# Patient Record
Sex: Female | Born: 1955
Health system: Southern US, Community
[De-identification: ages and names within clinical notes are randomized; demographics above are authoritative.]

## PROBLEM LIST (undated history)

## (undated) DIAGNOSIS — M545 Low back pain, unspecified: Secondary | ICD-10-CM

## (undated) DIAGNOSIS — G8929 Other chronic pain: Secondary | ICD-10-CM

## (undated) DIAGNOSIS — M5430 Sciatica, unspecified side: Secondary | ICD-10-CM

## (undated) DIAGNOSIS — I10 Essential (primary) hypertension: Secondary | ICD-10-CM

## (undated) DIAGNOSIS — M1990 Unspecified osteoarthritis, unspecified site: Secondary | ICD-10-CM

## (undated) DIAGNOSIS — R52 Pain, unspecified: Secondary | ICD-10-CM

## (undated) DIAGNOSIS — Z951 Presence of aortocoronary bypass graft: Secondary | ICD-10-CM

## (undated) HISTORY — PX: CORONARY ARTERY BYPASS GRAFT: SHX141

## (undated) HISTORY — PX: TOTAL ABDOMINAL HYSTERECTOMY: SHX209

## (undated) HISTORY — PX: CARDIAC SURGERY: SHX584

## (undated) HISTORY — PX: ABDOMINAL HYSTERECTOMY: SHX81

---

## 1998-02-15 ENCOUNTER — Ambulatory Visit (HOSPITAL_COMMUNITY): Admission: RE | Admit: 1998-02-15 | Discharge: 1998-02-15 | Payer: Self-pay | Admitting: Emergency Medicine

## 2002-05-04 ENCOUNTER — Emergency Department (HOSPITAL_COMMUNITY): Admission: EM | Admit: 2002-05-04 | Discharge: 2002-05-04 | Payer: Self-pay | Admitting: Emergency Medicine

## 2003-08-07 ENCOUNTER — Encounter: Admission: RE | Admit: 2003-08-07 | Discharge: 2003-08-07 | Payer: Self-pay | Admitting: Internal Medicine

## 2006-03-09 ENCOUNTER — Encounter: Admission: RE | Admit: 2006-03-09 | Discharge: 2006-03-09 | Payer: Self-pay | Admitting: Internal Medicine

## 2008-07-01 ENCOUNTER — Encounter: Admission: RE | Admit: 2008-07-01 | Discharge: 2008-07-01 | Payer: Self-pay | Admitting: Internal Medicine

## 2008-08-24 ENCOUNTER — Encounter: Admission: RE | Admit: 2008-08-24 | Discharge: 2008-08-24 | Payer: Self-pay | Admitting: Internal Medicine

## 2009-01-25 ENCOUNTER — Ambulatory Visit: Payer: Self-pay | Admitting: Radiology

## 2009-01-25 ENCOUNTER — Encounter: Payer: Self-pay | Admitting: Emergency Medicine

## 2009-01-25 ENCOUNTER — Inpatient Hospital Stay (HOSPITAL_COMMUNITY): Admission: EM | Admit: 2009-01-25 | Discharge: 2009-02-03 | Payer: Self-pay | Admitting: Cardiology

## 2009-01-26 ENCOUNTER — Ambulatory Visit: Payer: Self-pay | Admitting: Thoracic Surgery (Cardiothoracic Vascular Surgery)

## 2009-01-28 ENCOUNTER — Encounter: Payer: Self-pay | Admitting: Thoracic Surgery (Cardiothoracic Vascular Surgery)

## 2009-02-22 ENCOUNTER — Encounter
Admission: RE | Admit: 2009-02-22 | Discharge: 2009-02-22 | Payer: Self-pay | Admitting: Thoracic Surgery (Cardiothoracic Vascular Surgery)

## 2009-02-24 ENCOUNTER — Ambulatory Visit: Payer: Self-pay | Admitting: Thoracic Surgery (Cardiothoracic Vascular Surgery)

## 2009-03-16 ENCOUNTER — Ambulatory Visit: Payer: Self-pay | Admitting: Thoracic Surgery (Cardiothoracic Vascular Surgery)

## 2009-03-22 ENCOUNTER — Ambulatory Visit: Payer: Self-pay | Admitting: Thoracic Surgery (Cardiothoracic Vascular Surgery)

## 2009-04-15 ENCOUNTER — Ambulatory Visit: Payer: Self-pay | Admitting: Thoracic Surgery (Cardiothoracic Vascular Surgery)

## 2009-05-06 ENCOUNTER — Telehealth (INDEPENDENT_AMBULATORY_CARE_PROVIDER_SITE_OTHER): Payer: Self-pay | Admitting: *Deleted

## 2009-05-21 ENCOUNTER — Ambulatory Visit: Payer: Self-pay | Admitting: Thoracic Surgery (Cardiothoracic Vascular Surgery)

## 2009-06-18 ENCOUNTER — Ambulatory Visit: Payer: Self-pay | Admitting: Diagnostic Radiology

## 2009-06-18 ENCOUNTER — Emergency Department (HOSPITAL_BASED_OUTPATIENT_CLINIC_OR_DEPARTMENT_OTHER): Admission: EM | Admit: 2009-06-18 | Discharge: 2009-06-18 | Payer: Self-pay | Admitting: Emergency Medicine

## 2009-08-30 ENCOUNTER — Encounter: Admission: RE | Admit: 2009-08-30 | Discharge: 2009-08-30 | Payer: Self-pay | Admitting: Internal Medicine

## 2009-09-05 ENCOUNTER — Emergency Department (HOSPITAL_BASED_OUTPATIENT_CLINIC_OR_DEPARTMENT_OTHER): Admission: EM | Admit: 2009-09-05 | Discharge: 2009-09-05 | Payer: Self-pay | Admitting: Emergency Medicine

## 2009-09-08 ENCOUNTER — Emergency Department (HOSPITAL_BASED_OUTPATIENT_CLINIC_OR_DEPARTMENT_OTHER): Admission: EM | Admit: 2009-09-08 | Discharge: 2009-09-08 | Payer: Self-pay | Admitting: Emergency Medicine

## 2009-09-10 ENCOUNTER — Emergency Department (HOSPITAL_BASED_OUTPATIENT_CLINIC_OR_DEPARTMENT_OTHER): Admission: EM | Admit: 2009-09-10 | Discharge: 2009-09-10 | Payer: Self-pay | Admitting: Emergency Medicine

## 2010-04-14 ENCOUNTER — Emergency Department (HOSPITAL_BASED_OUTPATIENT_CLINIC_OR_DEPARTMENT_OTHER): Admission: EM | Admit: 2010-04-14 | Discharge: 2009-09-02 | Payer: Self-pay | Admitting: Emergency Medicine

## 2010-05-29 ENCOUNTER — Encounter: Payer: Self-pay | Admitting: Thoracic Surgery (Cardiothoracic Vascular Surgery)

## 2010-05-29 ENCOUNTER — Encounter: Payer: Self-pay | Admitting: Internal Medicine

## 2010-05-29 ENCOUNTER — Encounter: Payer: Self-pay | Admitting: Orthopedic Surgery

## 2010-08-12 LAB — POCT I-STAT 4, (NA,K, GLUC, HGB,HCT)
Glucose, Bld: 137 mg/dL — ABNORMAL HIGH (ref 70–99)
Glucose, Bld: 164 mg/dL — ABNORMAL HIGH (ref 70–99)
Glucose, Bld: 166 mg/dL — ABNORMAL HIGH (ref 70–99)
HCT: 29 % — ABNORMAL LOW (ref 36.0–46.0)
HCT: 32 % — ABNORMAL LOW (ref 36.0–46.0)
Hemoglobin: 10.2 g/dL — ABNORMAL LOW (ref 12.0–15.0)
Hemoglobin: 12.9 g/dL (ref 12.0–15.0)
Hemoglobin: 13.9 g/dL (ref 12.0–15.0)
Potassium: 3.2 mEq/L — ABNORMAL LOW (ref 3.5–5.1)
Potassium: 3.7 mEq/L (ref 3.5–5.1)
Potassium: 4.8 mEq/L (ref 3.5–5.1)
Sodium: 137 mEq/L (ref 135–145)
Sodium: 138 mEq/L (ref 135–145)

## 2010-08-12 LAB — BASIC METABOLIC PANEL
BUN: 10 mg/dL (ref 6–23)
BUN: 11 mg/dL (ref 6–23)
BUN: 9 mg/dL (ref 6–23)
CO2: 23 mEq/L (ref 19–32)
CO2: 26 mEq/L (ref 19–32)
CO2: 27 mEq/L (ref 19–32)
CO2: 28 mEq/L (ref 19–32)
CO2: 29 mEq/L (ref 19–32)
CO2: 29 mEq/L (ref 19–32)
CO2: 30 mEq/L (ref 19–32)
Calcium: 8.5 mg/dL (ref 8.4–10.5)
Calcium: 8.5 mg/dL (ref 8.4–10.5)
Calcium: 8.6 mg/dL (ref 8.4–10.5)
Calcium: 8.6 mg/dL (ref 8.4–10.5)
Calcium: 9 mg/dL (ref 8.4–10.5)
Calcium: 9.3 mg/dL (ref 8.4–10.5)
Calcium: 9.6 mg/dL (ref 8.4–10.5)
Chloride: 100 mEq/L (ref 96–112)
Chloride: 102 mEq/L (ref 96–112)
Chloride: 104 mEq/L (ref 96–112)
Chloride: 104 mEq/L (ref 96–112)
Chloride: 104 mEq/L (ref 96–112)
Chloride: 98 mEq/L (ref 96–112)
Creatinine, Ser: 0.64 mg/dL (ref 0.4–1.2)
Creatinine, Ser: 0.65 mg/dL (ref 0.4–1.2)
Creatinine, Ser: 0.68 mg/dL (ref 0.4–1.2)
Creatinine, Ser: 0.7 mg/dL (ref 0.4–1.2)
Creatinine, Ser: 0.82 mg/dL (ref 0.4–1.2)
GFR calc Af Amer: >60 mL/min (ref >60)
GFR calc Af Amer: >60 mL/min (ref >60)
GFR calc Af Amer: >60 mL/min (ref >60)
GFR calc Af Amer: >60 mL/min (ref >60)
GFR calc Af Amer: >60 mL/min (ref >60)
GFR calc Af Amer: >60 mL/min (ref >60)
GFR calc non Af Amer: >60 mL/min (ref >60)
GFR calc non Af Amer: >60 mL/min (ref >60)
GFR calc non Af Amer: >60 mL/min (ref >60)
GFR calc non Af Amer: >60 mL/min (ref >60)
GFR calc non Af Amer: >60 mL/min (ref >60)
Glucose, Bld: 159 mg/dL — ABNORMAL HIGH (ref 70–99)
Glucose, Bld: 159 mg/dL — ABNORMAL HIGH (ref 70–99)
Glucose, Bld: 166 mg/dL — ABNORMAL HIGH (ref 70–99)
Glucose, Bld: 170 mg/dL — ABNORMAL HIGH (ref 70–99)
Glucose, Bld: 204 mg/dL — ABNORMAL HIGH (ref 70–99)
Potassium: 3.7 mEq/L (ref 3.5–5.1)
Potassium: 3.8 mEq/L (ref 3.5–5.1)
Potassium: 3.9 mEq/L (ref 3.5–5.1)
Sodium: 134 mEq/L — ABNORMAL LOW (ref 135–145)
Sodium: 134 mEq/L — ABNORMAL LOW (ref 135–145)
Sodium: 135 mEq/L (ref 135–145)
Sodium: 138 mEq/L (ref 135–145)
Sodium: 138 mEq/L (ref 135–145)
Sodium: 140 mEq/L (ref 135–145)

## 2010-08-12 LAB — CBC
HCT: 25.6 % — ABNORMAL LOW (ref 36.0–46.0)
HCT: 27.9 % — ABNORMAL LOW (ref 36.0–46.0)
HCT: 42.1 % (ref 36.0–46.0)
HCT: 42.6 % (ref 36.0–46.0)
Hemoglobin: 10.7 g/dL — ABNORMAL LOW (ref 12.0–15.0)
Hemoglobin: 11.1 g/dL — ABNORMAL LOW (ref 12.0–15.0)
Hemoglobin: 13.6 g/dL (ref 12.0–15.0)
Hemoglobin: 14.3 g/dL (ref 12.0–15.0)
Hemoglobin: 14.4 g/dL (ref 12.0–15.0)
Hemoglobin: 15 g/dL (ref 12.0–15.0)
Hemoglobin: 8.8 g/dL — ABNORMAL LOW (ref 12.0–15.0)
Hemoglobin: 9.5 g/dL — ABNORMAL LOW (ref 12.0–15.0)
MCHC: 33.5 g/dL (ref 30.0–36.0)
MCHC: 33.7 g/dL (ref 30.0–36.0)
MCHC: 33.9 g/dL (ref 30.0–36.0)
MCHC: 34 g/dL (ref 30.0–36.0)
MCHC: 34 g/dL (ref 30.0–36.0)
MCHC: 34.1 g/dL (ref 30.0–36.0)
MCHC: 34.4 g/dL (ref 30.0–36.0)
MCV: 91 fL (ref 78.0–100.0)
MCV: 91.1 fL (ref 78.0–100.0)
MCV: 91.3 fL (ref 78.0–100.0)
MCV: 91.9 fL (ref 78.0–100.0)
MCV: 91.9 fL (ref 78.0–100.0)
MCV: 92.3 fL (ref 78.0–100.0)
MCV: 92.7 fL (ref 78.0–100.0)
Platelets: 167 10*3/uL (ref 150–400)
Platelets: 189 10*3/uL (ref 150–400)
Platelets: 191 10*3/uL (ref 150–400)
Platelets: 261 10*3/uL (ref 150–400)
RBC: 2.81 MIL/uL — ABNORMAL LOW (ref 3.87–5.11)
RBC: 3.03 MIL/uL — ABNORMAL LOW (ref 3.87–5.11)
RBC: 3.43 MIL/uL — ABNORMAL LOW (ref 3.87–5.11)
RBC: 3.58 MIL/uL — ABNORMAL LOW (ref 3.87–5.11)
RBC: 4.56 MIL/uL (ref 3.87–5.11)
RBC: 4.64 MIL/uL (ref 3.87–5.11)
RDW: 13.1 % (ref 11.5–15.5)
RDW: 13.4 % (ref 11.5–15.5)
RDW: 13.5 % (ref 11.5–15.5)
RDW: 13.6 % (ref 11.5–15.5)
RDW: 13.9 % (ref 11.5–15.5)
WBC: 11.6 10*3/uL — ABNORMAL HIGH (ref 4.0–10.5)
WBC: 12 10*3/uL — ABNORMAL HIGH (ref 4.0–10.5)
WBC: 12.1 10*3/uL — ABNORMAL HIGH (ref 4.0–10.5)
WBC: 12.4 10*3/uL — ABNORMAL HIGH (ref 4.0–10.5)
WBC: 6.8 10*3/uL (ref 4.0–10.5)
WBC: 7.2 10*3/uL (ref 4.0–10.5)
WBC: 9.2 10*3/uL (ref 4.0–10.5)

## 2010-08-12 LAB — BLOOD GAS, ARTERIAL
Acid-Base Excess: 1.8 mmol/L (ref 0.0–2.0)
O2 Saturation: 97.5 %
TCO2: 26.6 mmol/L (ref 0–100)
pCO2 arterial: 37.4 mmHg (ref 35.0–45.0)
pO2, Arterial: 90.2 mmHg (ref 80.0–100.0)

## 2010-08-12 LAB — GLUCOSE, CAPILLARY
Glucose-Capillary: 116 mg/dL — ABNORMAL HIGH (ref 70–99)
Glucose-Capillary: 120 mg/dL — ABNORMAL HIGH (ref 70–99)
Glucose-Capillary: 126 mg/dL — ABNORMAL HIGH (ref 70–99)
Glucose-Capillary: 129 mg/dL — ABNORMAL HIGH (ref 70–99)
Glucose-Capillary: 130 mg/dL — ABNORMAL HIGH (ref 70–99)
Glucose-Capillary: 145 mg/dL — ABNORMAL HIGH (ref 70–99)
Glucose-Capillary: 154 mg/dL — ABNORMAL HIGH (ref 70–99)
Glucose-Capillary: 155 mg/dL — ABNORMAL HIGH (ref 70–99)
Glucose-Capillary: 164 mg/dL — ABNORMAL HIGH (ref 70–99)
Glucose-Capillary: 165 mg/dL — ABNORMAL HIGH (ref 70–99)
Glucose-Capillary: 169 mg/dL — ABNORMAL HIGH (ref 70–99)
Glucose-Capillary: 170 mg/dL — ABNORMAL HIGH (ref 70–99)
Glucose-Capillary: 172 mg/dL — ABNORMAL HIGH (ref 70–99)
Glucose-Capillary: 180 mg/dL — ABNORMAL HIGH (ref 70–99)
Glucose-Capillary: 183 mg/dL — ABNORMAL HIGH (ref 70–99)
Glucose-Capillary: 191 mg/dL — ABNORMAL HIGH (ref 70–99)
Glucose-Capillary: 225 mg/dL — ABNORMAL HIGH (ref 70–99)
Glucose-Capillary: 94 mg/dL (ref 70–99)

## 2010-08-12 LAB — POCT I-STAT 3, ART BLOOD GAS (G3+)
Acid-base deficit: 1 mmol/L (ref 0.0–2.0)
O2 Saturation: 100 %
O2 Saturation: 95 %
Patient temperature: 35.3
Patient temperature: 37
TCO2: 25 mmol/L (ref 0–100)
pH, Arterial: 7.372 (ref 7.350–7.400)

## 2010-08-12 LAB — CREATININE, SERUM
Creatinine, Ser: 0.6 mg/dL (ref 0.4–1.2)
Creatinine, Ser: 0.65 mg/dL (ref 0.4–1.2)
GFR calc Af Amer: >60 mL/min (ref >60)
GFR calc Af Amer: >60 mL/min (ref >60)
GFR calc non Af Amer: >60 mL/min (ref >60)

## 2010-08-12 LAB — DIFFERENTIAL
Eosinophils Absolute: 0.2 10*3/uL (ref 0.0–0.7)
Eosinophils Relative: 2 % (ref 0–5)
Lymphs Abs: 2.3 10*3/uL (ref 0.7–4.0)

## 2010-08-12 LAB — POCT I-STAT, CHEM 8
Creatinine, Ser: 0.5 mg/dL (ref 0.4–1.2)
Glucose, Bld: 133 mg/dL — ABNORMAL HIGH (ref 70–99)
Hemoglobin: 12.6 g/dL (ref 12.0–15.0)
TCO2: 24 mmol/L (ref 0–100)

## 2010-08-12 LAB — PROTIME-INR
INR: 1 (ref 0.00–1.49)
Prothrombin Time: 16.4 seconds — ABNORMAL HIGH (ref 11.6–15.2)

## 2010-08-12 LAB — CARDIAC PANEL(CRET KIN+CKTOT+MB+TROPI)
CK, MB: 1 ng/mL (ref 0.3–4.0)
CK, MB: 1 ng/mL (ref 0.3–4.0)
CK, MB: 1 ng/mL (ref 0.3–4.0)
Relative Index: INVALID (ref 0.0–2.5)
Relative Index: INVALID (ref 0.0–2.5)
Total CK: 71 U/L (ref 7–177)
Total CK: 71 U/L (ref 7–177)
Troponin I: 0.02 ng/mL (ref 0.00–0.06)

## 2010-08-12 LAB — MAGNESIUM: Magnesium: 1.9 mg/dL (ref 1.5–2.5)

## 2010-08-12 LAB — HEPATIC FUNCTION PANEL
ALT: 20 U/L (ref 0–35)
AST: 15 U/L (ref 0–37)
Alkaline Phosphatase: 75 U/L (ref 39–117)
Total Bilirubin: 1.1 mg/dL (ref 0.3–1.2)
Total Protein: 6.8 g/dL (ref 6.0–8.3)

## 2010-08-12 LAB — HEMOGLOBIN A1C
Hgb A1c MFr Bld: 8.9 % — ABNORMAL HIGH (ref 4.6–6.1)
Mean Plasma Glucose: 209 mg/dL

## 2010-08-12 LAB — LIPID PANEL
Cholesterol: 208 mg/dL — ABNORMAL HIGH (ref 0–200)
HDL: 41 mg/dL (ref >39)
Triglycerides: 293 mg/dL — ABNORMAL HIGH (ref <150)

## 2010-08-12 LAB — URINALYSIS, DIPSTICK ONLY
Glucose, UA: NEGATIVE mg/dL
Leukocytes, UA: NEGATIVE
Nitrite: NEGATIVE
Specific Gravity, Urine: 1.028 (ref 1.005–1.030)
pH: 6 (ref 5.0–8.0)

## 2010-08-12 LAB — HEMOGLOBIN AND HEMATOCRIT, BLOOD
HCT: 28.3 % — ABNORMAL LOW (ref 36.0–46.0)
Hemoglobin: 9.6 g/dL — ABNORMAL LOW (ref 12.0–15.0)

## 2010-08-12 LAB — MRSA PCR SCREENING: MRSA by PCR: NEGATIVE

## 2010-08-12 LAB — PLATELET COUNT: Platelets: 185 10*3/uL (ref 150–400)

## 2010-08-12 LAB — TYPE AND SCREEN: ABO/RH(D): O POS

## 2010-09-20 NOTE — Assessment & Plan Note (Signed)
OFFICE VISIT   Gail Humphrey, Gail Humphrey  DOB:  [birth year]                                        April 15, 2009  CHART #:  [id number]   HISTORY OF PRESENT ILLNESS:  The patient returns for followup.  She had  a coronary bypass grafting back in September.  She had been having  significant incisional pain ever since.  She says that especially when  she wakes up in the morning she has intense pain, particularly at the  upper portion of her sternum.  She has pain all along the incision and  sometimes a burning pain that is under both breasts and also complains  of pain in her incision in her left knee and some numbness around that  site.   PHYSICAL EXAMINATION:  The patient's blood pressure is 151/92, pulse 72,  respirations are 18, her ox saturation is 98% on room air.  Her sternum  is stable.  Sternal incision is well healed.  Leg incision is well  healed.  There are no signs of infection.   IMPRESSION:  The patient continues to have postoperative pain both at  her sternal and leg incisions.  The incisions are healing well with no  signs of infection or inflammation.  I gave her a prescription for  additional 100 Percocet tablets 5/325.  She had concerns about the  amount of acetaminophen, but I told her as long as she is taking less  than 12 tablets a day which she is it should not be an issue.  She can  take 1-2 as needed up to 4 times daily p.r.n.  She is set to return to  work on May 16, 2008.  She has concerns about lifting and I do not  think she will be able to lift heavy objects which could cause  difficulties with her job, but I am going to restrict her at this  point to lifting anything that weighs over 25 pounds until we have some  improvement in her pain.   Revonda Standard Roxan Hockey, M.D.  Electronically Signed   SCH/MEDQ  D:  04/15/2009  T:  04/16/2009  Job:  [id number]   cc:   Jilda Panda, M.D.  Revonda Standard Rohrbeck, MD

## 2010-09-20 NOTE — Assessment & Plan Note (Signed)
OFFICE VISIT   Gail Humphrey, Gail Humphrey  DOB:  08/03/1955                                        May 21, 2009  CHART #:  [id number]   The patient is a 55 year old woman who had coronary bypass grafting in  September 2010.  She had been having significant incisional pain since  that time.  She was last seen in the office on April 15, 2009.  At  which time, she was still having significant pain in both her sternal  and leg incisions.  She was taking Percocet several times daily and was  not able to return to work at that time.  She did return to work early  this week, but presently is on light duty as we still have her  restricted from lifting any objects greater than 25 pounds.  I feel that  she is not doing so for at least 6 months.   On physical examination, the sternum is stable.  The sternal and leg  incisions are both well healed with no signs of infection.   IMPRESSION:  The patient has persistent postoperative pain.  She is  about 3 months out from surgery and has gotten better.  She says she is  now not taking any pain medication when she is at work and she is only  taking 1 or 2 pain pills a day, mostly she takes 1 at night before she  goes to bed.  Her pain has improved significantly, but is still present.  I did discuss with her an option of going back again and removing the  sternal wires as most of her pain is related to that area versus the leg  at the present time.  She does not want entertain that thought  presently, and also it would be better to wait a little longer as she  has shown some improvement, but there remains an option if she were to  continue to have pain she will contact us and we can discuss that  further.  In the in  the meantime, I still believe that she should not lift any objects  weighing greater than 25 pounds until 6 months from the time of her  surgery.   Revonda Standard Roxan Hockey, M.D.  Electronically Signed   SCH/MEDQ   D:  05/21/2009  T:  05/22/2009  Job:  [id number]   cc:   Jilda Panda, M.D.  Dr. Clarene Critchley

## 2010-09-20 NOTE — Assessment & Plan Note (Signed)
OFFICE VISIT   Gail Humphrey, Gail Humphrey  DOB:  08-22-1955                                        March 16, 2009  CHART #:  [id number]   Reason is followup after coronary bypass grafting.   HISTORY:  The patient is a 55 year old woman who underwent coronary  bypass grafting x4 on January 29, 2009.  She was seen in the office on  February 24, 2009, at which time she was complaining still of severe  pain.  She had been taking hydrocodone which was not giving her any  relief.  She had done well with oxycodone prior to that.  She was  complaining of pain in her calf.  A duplex was done to rule out DVT and  that was negative.  She was given a prescription for oxycodone 10 mg  tablets 50 of them, she has been taking about 4 of those a day.  Apparently she and Dr. Marlou Porch have had some type of falling out and she  will not be following up with him.   The patient states that she has been having continued pain.  She  describes in her shoulder, chest incision, breasts bilaterally, legs  both sides, both groins.   PHYSICAL EXAMINATION:  The patient is a 55 year old woman in no acute  distress.  Blood pressure 152/95, pulse 77, respirations 18, her oxygen  saturation is 96% on room air.  Her incisions are healing well with no  signs of infection.  Her sternum is stable, but she does have  significant tenderness with palpation along the sternum and with  coughing but I do not feel any clicking or popping or movement of the  sternum.  She does have tenderness in both groins to palpation.  I do  not feel any evidence of pseudoaneurysm.   No laboratory data today.   IMPRESSION:  The patient is a 55 year old woman status post coronary  bypass grafting.  She is still having a great deal of difficulty with  pain.  She has also got a lot of issues related to financial concerns  and whether if she will be able to return to work.  She is still taking  narcotics with relative  frequency but is only 6 weeks out from surgery,  so it is not out of the realm of normal, although she is on the high end  of the scale in terms of discomfort.  Notably, she does complain of pain  even with palpation in the right groin pain.  I gave her a prescription  for oxycodone 5/325 one to two 4 times daily as needed for pain, 100  tablets, no refills, but I did discuss with her that how easy it is to  become tolerant to or even become addicted to narcotics in this  situation, and I want to have her off them by 6 weeks from now at the  very latest.  I did advise her to try Aleve or Advil in the interim and  only reserve the narcotics when she was having more severe pain.  I did  discuss with her that anti-inflammatories may relieve the underlying  source of the pain better because the narcotics did not do anything to  decrease inflammation.  I will plan to see her back in a month.  We will  check on her  progress at that time.  I did tell her in terms of return  to work I think it will probably be the first of the year before she is  able to go back, but if she does have a remarkable transformation and  becomes more comfortable and more active and able to be much more  physically active then she could go back at anytime.  We are going to  help her try and set up an appointment with the cardiologist.  She had  heard good things about Dr. Daneen Schick although he is in the same  practice with Dr. Marlou Porch.  We will try and set that up if possible.  If  not, we will try and set her up with Dr. Marijo File at the [facility] in  [facility].   Revonda Standard Roxan Hockey, M.D.  Electronically Signed   SCH/MEDQ  D:  03/16/2009  T:  03/16/2009  Job:  [id number]   cc:   Jilda Panda, M.D.

## 2010-09-20 NOTE — Assessment & Plan Note (Signed)
OFFICE VISIT   ARBIE, Gail Humphrey  DOB:  [birth year]                                        February 24, 2009  CHART #:  [id number]   REASON FOR VISIT:  Followup after recent coronary bypass grafting.   HISTORY OF PRESENT ILLNESS:  The patient is a 55 year old woman with  multiple cardiac risk factors who presented with 2 weeks of progressive  chest pain and shortness of breath.  She was admitted with unstable  symptoms and at catheterization had left main and three-vessel coronary  disease.  She underwent coronary bypass grafting x4 on January 29, 2009.  Postoperatively, her course was uncomplicated.  She was  discharged on postoperative day #5.  Since then, she also complaints she  is still having a lot of discomfort primarily in her breasts.  She says  her nipples are very sensitive.  She is not feeling clicking or popping  in the sternum.  She had been taking oxycodone which gave her good  relief, but when she was changed to Vicodin she was not having adequate  relief, she is having some difficulty sleeping, her appetite remains  poor.  Her weight is about 5 pounds up from her normal while she was  going into the hospital.  She also complains of some pain in her calves  primarily on the left side.   PHYSICAL EXAMINATION:  The patient is a 55 year old woman in obvious  discomfort but no acute distress.  Neurologically, she is alert and  oriented x3 with no focal deficits.  Her neck is without jugular venous  distention.  Cardiac exam has regular rate and rhythm.  Normal S1 and  S2.  Lungs are clear with equal breath sounds bilaterally.  Her sternal  incision is clean, dry, intact, and healing well.  Chest tube sites have  eschar, but no signs of infection.  Leg incisions are healing well.  She  does have trace pitting edema on the right leg and 1+ pitting edema on  the left.  She has an incision on the right leg but the vein was not  harvested from that  side.  The vein was harvested from the left side.  She does have some calf tenderness on the left.   DIAGNOSTIC TESTS:  Chest x-ray shows good aeration of the lungs  bilaterally.  There is no effusions or infiltrates.   IMPRESSION:  The patient is a 55 year old woman who is now about 3 weeks  out from coronary bypass grafting and is progressing about as expected.  She may be a little more pain than the average, but other than that her  progress has been about as expected.  Her exercise has been somewhat  limited primarily due to discomfort.  I did give her a prescription for  oxycodone 10/325 and 50 tablets.  He can take one of those up to 3 times  a day as needed for pain.  She does have some edema in the left leg  where the vein was harvested.  I am going to put her back on a lower  dose of Lasix 20 mg daily for about 2 weeks just until her weight gets  back to normal.  I did give her 1 refill on that prescription in case  she continues to have swelling beyond that time, but I  suspect that will  take care of it.  I am not going to put her on a potassium supplement.  I did advise her to eat a banana or tomato daily until she is off the  Lasix.   She does have calf tenderness which is a little more prominent than you  would typically see with an endoscopic saphenous venectomy, and I am not  sure if this is just a manifestation of her generalized tenderness, but  with a combination of some swelling and tenderness I do think that a  duplex is necessary to rule out DVT.  We will have that done today at  [facility].   I will plan to see her back in about 3 weeks assuming the duplex is  negative, and there is no DVT in the [facility] office.   Revonda Standard Roxan Hockey, M.D.  Electronically Signed   SCH/MEDQ  D:  02/24/2009  T:  02/24/2009  Job:  [id number]   cc:   Jerline Pain, MD  Jilda Panda, M.D.

## 2010-09-20 NOTE — Assessment & Plan Note (Signed)
OFFICE VISIT   Gail Humphrey, Gail Humphrey  DOB:  11/21/1955                                        March 22, 2009  CHART #:  [id number]   HISTORY:  The patient is a 55 year old female who was hospitalized in  September 2010 for coronary artery bypass grafting x4 by Dr. Roxan Hockey  for severe three-vessel coronary artery disease.  She called the office  over the weekend concerned about some discomfort in her sternal  incision.  She was asked to come to the office today to have it checked.  She reports that she has had some mild chills last evening.  She has not  had a fever.  She does not note any sternal instability.  She is  otherwise feeling fairly well.   PHYSICAL EXAMINATION:  The sternal incision is examined.  It appears to  be well healed.  There is no evidence of erythema.  There is no  drainage.  There is no flocculence.  The sternum itself is stable to  palpation without click.   ASSESSMENT:  The patient's sternal incision is examined and appears to  be healing quite nicely.  I discussed with her the possibility that she  may have some other source of her discomfort in particular, she may be  developing an upper respiratory infection or some other viral illness.  I have encouraged to check her temperature frequently during the day  over the next couple of days.  She will return if there is any new  issues related to the incision such as drainage, erythema, or sternal  clicking.  We will see her again on a p.r.n. basis.   John Giovanni, P.A.-C.   Gail Humphrey  D:  03/22/2009  T:  03/23/2009  Job:  [id number]

## 2010-12-25 ENCOUNTER — Emergency Department (INDEPENDENT_AMBULATORY_CARE_PROVIDER_SITE_OTHER): Payer: Federal, State, Local not specified - PPO

## 2010-12-25 ENCOUNTER — Encounter: Payer: Self-pay | Admitting: *Deleted

## 2010-12-25 ENCOUNTER — Emergency Department (HOSPITAL_BASED_OUTPATIENT_CLINIC_OR_DEPARTMENT_OTHER)
Admission: EM | Admit: 2010-12-25 | Discharge: 2010-12-25 | Disposition: A | Discharge status: Home / Self Care | Payer: Federal, State, Local not specified - PPO | Attending: Emergency Medicine | Admitting: Emergency Medicine

## 2010-12-25 DIAGNOSIS — Z8679 Personal history of other diseases of the circulatory system: Secondary | ICD-10-CM

## 2010-12-25 DIAGNOSIS — R220 Localized swelling, mass and lump, head: Secondary | ICD-10-CM

## 2010-12-25 DIAGNOSIS — E119 Type 2 diabetes mellitus without complications: Secondary | ICD-10-CM

## 2010-12-25 DIAGNOSIS — K1370 Unspecified lesions of oral mucosa: Secondary | ICD-10-CM

## 2010-12-25 DIAGNOSIS — K0889 Other specified disorders of teeth and supporting structures: Secondary | ICD-10-CM

## 2010-12-25 DIAGNOSIS — R221 Localized swelling, mass and lump, neck: Secondary | ICD-10-CM

## 2010-12-25 DIAGNOSIS — K089 Disorder of teeth and supporting structures, unspecified: Secondary | ICD-10-CM | POA: Insufficient documentation

## 2010-12-25 HISTORY — DX: Presence of aortocoronary bypass graft: Z95.1

## 2010-12-25 HISTORY — DX: Essential (primary) hypertension: I10

## 2010-12-25 LAB — CBC
HCT: 45.5 % (ref 36.0–46.0)
MCH: 30.2 pg (ref 26.0–34.0)
MCHC: 34.1 g/dL (ref 30.0–36.0)
MCV: 88.7 fL (ref 78.0–100.0)
RDW: 14.1 % (ref 11.5–15.5)

## 2010-12-25 LAB — BASIC METABOLIC PANEL
CO2: 24 mEq/L (ref 19–32)
Calcium: 9.6 mg/dL (ref 8.4–10.5)
Creatinine, Ser: 0.6 mg/dL (ref 0.50–1.10)
GFR calc non Af Amer: >60 mL/min (ref >60)

## 2010-12-25 LAB — DIFFERENTIAL
Basophils Absolute: 0 10*3/uL (ref 0.0–0.1)
Eosinophils Absolute: 0.2 10*3/uL (ref 0.0–0.7)
Eosinophils Relative: 2 % (ref 0–5)
Lymphocytes Relative: 30 % (ref 12–46)
Monocytes Absolute: 0.8 10*3/uL (ref 0.1–1.0)

## 2010-12-25 MED ORDER — CLINDAMYCIN HCL 150 MG PO CAPS: 150.0000 mg | ORAL_CAPSULE | Freq: Four times a day (QID) | ORAL | Status: AC

## 2010-12-25 MED ORDER — OXYCODONE-ACETAMINOPHEN 10-325 MG PO TABS: 1.0000 | ORAL_TABLET | ORAL | Status: AC | PRN

## 2010-12-25 MED ORDER — HYDROMORPHONE HCL 1 MG/ML IJ SOLN
1.0000 mg | Freq: Once | INTRAMUSCULAR | Status: AC
  Administered 2010-12-25: 1 mg via INTRAVENOUS
  Filled 2010-12-25: qty 1

## 2010-12-25 MED ORDER — IOHEXOL 300 MG/ML  SOLN
100.0000 mL | Freq: Once | INTRAMUSCULAR | Status: AC | PRN
  Administered 2010-12-25: 100 mL via INTRAVENOUS

## 2010-12-25 MED ORDER — ONDANSETRON HCL 4 MG/2ML IJ SOLN
4.0000 mg | Freq: Once | INTRAMUSCULAR | Status: AC
  Administered 2010-12-25: 4 mg via INTRAVENOUS
  Filled 2010-12-25: qty 2

## 2010-12-25 MED ORDER — SODIUM CHLORIDE 0.9 % IV SOLN
Freq: Once | INTRAVENOUS | Status: AC
  Administered 2010-12-25: 04:00:00 via INTRAVENOUS

## 2010-12-25 MED ORDER — CLINDAMYCIN PHOSPHATE 600 MG/50ML IV SOLN
600.0000 mg | Freq: Once | INTRAVENOUS | Status: AC
  Administered 2010-12-25: 600 mg via INTRAVENOUS
  Filled 2010-12-25: qty 50

## 2010-12-25 NOTE — ED Provider Notes (Signed)
History     CSN: [id number] Arrival date & time: 12/25/2010  3:19 AM  Chief Complaint  Patient presents with  . Oral Swelling   HPI This is a 55 year old black female with a two-day history of pain and swelling in her mandible. The pain initiated at her left lower lateral incisor. This incisor is loose. The pain has spread to her sublingual soft tissue as well as under her chin. She is also having pain at her left lower first molar; she has had an infection related to that tooth treated in the past with penicillin but states did not fully resolve. She states the pain is severe, worse with palpation or eating. It has not been relieved with Orajel. She denies fevers chills nausea or vomiting. She denies breathing difficulty.  No past medical history on file.  No past surgical history on file.  No family history on file.  History  Substance Use Topics  . Smoking status: Not on file  . Smokeless tobacco: Not on file  . Alcohol Use: Not on file    OB History    Grav Para Term Preterm Abortions TAB SAB Ect Mult Living                  Review of Systems  All other systems reviewed and are negative.    Physical Exam  There were no vitals taken for this visit.  Physical Exam General: Well-developed, well-nourished female in no acute distress; appearance consistent with age of record HENT: normocephalic, atraumatic; loose left lower lateral incisor with significant pain on palpation or percussion; swelling and tenderness of sublingual soft tissue and to a lesser extent the submandibular soft tissue; there is erosion of the superficial buccal mucosa of the lower lip and gum which the patient attributes to Orajel application Eyes: pupils equal round and reactive to light; extraocular muscles intact Neck: supple;  Heart: regular rate and rhythm; no murmurs, rubs or gallops Lungs: clear to auscultation bilaterally Abdomen: soft; nontender; nondistended; no masses or hepatosplenomegaly;  bowel sounds present Extremities: No deformity; full range of motion; pulses normal Neurologic: Awake, alert and oriented;motor function intact in all extremities and symmetric;sensation grossly intact; no facial droop Skin: Warm and dry Psychiatric: Normal mood and affect   ED Course  Procedures  MDM Nursing notes and vitals signs, including pulse oximetry, reviewed.  Summary of this visit's results, reviewed by myself:  Labs:  Results for orders placed during the hospital encounter of 12/25/10  CBC      Component Value Range   WBC 9.6  4.0 - 10.5 (K/uL)   RBC 5.13 (*) 3.87 - 5.11 (MIL/uL)   Hemoglobin 15.5 (*) 12.0 - 15.0 (g/dL)   HCT 45.5  36.0 - 46.0 (%)   MCV 88.7  78.0 - 100.0 (fL)   MCH 30.2  26.0 - 34.0 (pg)   MCHC 34.1  30.0 - 36.0 (g/dL)   RDW 14.1  11.5 - 15.5 (%)   Platelets 278  150 - 400 (K/uL)  DIFFERENTIAL      Component Value Range   Neutrophils Relative 59  43 - 77 (%)   Neutro Abs 5.7  1.7 - 7.7 (K/uL)   Lymphocytes Relative 30  12 - 46 (%)   Lymphs Abs 2.9  0.7 - 4.0 (K/uL)   Monocytes Relative 9  3 - 12 (%)   Monocytes Absolute 0.8  0.1 - 1.0 (K/uL)   Eosinophils Relative 2  0 - 5 (%)   Eosinophils  Absolute 0.2  0.0 - 0.7 (K/uL)   Basophils Relative 0  0 - 1 (%)   Basophils Absolute 0.0  0.0 - 0.1 (K/uL)  BASIC METABOLIC PANEL      Component Value Range   Sodium 137  135 - 145 (mEq/L)   Potassium 3.9  3.5 - 5.1 (mEq/L)   Chloride 101  96 - 112 (mEq/L)   CO2 24  19 - 32 (mEq/L)   Glucose, Bld 182 (*) 70 - 99 (mg/dL)   BUN 12  6 - 23 (mg/dL)   Creatinine, Ser 0.60  0.50 - 1.10 (mg/dL)   Calcium 9.6  8.4 - 10.5 (mg/dL)   GFR calc non Af Amer >60  >60 (mL/min)   GFR calc Af Amer >60  >60 (mL/min)    Imaging Studies: Ct Maxillofacial W/cm  12/25/2010  *RADIOLOGY REPORT*  Clinical Data: Mouth pain and swelling; treated for infection in June.  History of diabetes and smoking.  Assess for Ludwig's angina.  CT MAXILLOFACIAL WITH CONTRAST  Technique:   Multidetector CT imaging of the maxillofacial structures was performed with intravenous contrast. Multiplanar CT image reconstructions were also generated.  Contrast: 80 mL of Omnipaque 300 IV contrast  Comparison: None.  Findings: No significant soft tissue abnormalities are seen.  The parapharyngeal fat planes are preserved.  The palatine tonsils are unremarkable in appearance.  No abnormalities are characterized at the floor of the mouth.  There is no evidence of cervical lymphadenopathy; the submandibular and parotid glands are unremarkable in appearance.  The nasopharynx, oropharynx and hypopharynx are unremarkable in appearance.  The visualized portions of the valleculae and piriform sinuses are grossly unremarkable.  There is no evidence of fracture or dislocation.  The maxilla and mandible appear intact.  The nasal bone is unremarkable in appearance.  The visualized dentition demonstrates no acute abnormality.  The orbits are intact bilaterally.  The paranasal sinuses are clear.  IMPRESSION: No significant soft tissue abnormalities seen; no evidence for Ludwig's angina.  Original Report Authenticated By: Santa Lighter, M.D.    Will treat for soft tissue infection and refer back to dentistry.       Wynetta Fines, MD 12/25/10 [phone]

## 2010-12-25 NOTE — ED Notes (Signed)
Pt has swelling and pain of gums. Was treated for an infection bin June but states that it never fully cleared up.

## 2010-12-25 NOTE — ED Notes (Signed)
pts o2 sats dropping to 70's while awake. Pt placed on 2L Naplate with sats improving to 90's.

## 2010-12-28 ENCOUNTER — Telehealth (HOSPITAL_BASED_OUTPATIENT_CLINIC_OR_DEPARTMENT_OTHER): Payer: Self-pay | Admitting: Emergency Medicine

## 2011-01-31 ENCOUNTER — Encounter (HOSPITAL_BASED_OUTPATIENT_CLINIC_OR_DEPARTMENT_OTHER): Payer: Self-pay | Admitting: *Deleted

## 2011-01-31 ENCOUNTER — Emergency Department (HOSPITAL_BASED_OUTPATIENT_CLINIC_OR_DEPARTMENT_OTHER)
Admission: EM | Admit: 2011-01-31 | Discharge: 2011-01-31 | Disposition: A | Discharge status: Home / Self Care | Payer: Federal, State, Local not specified - PPO | Attending: Emergency Medicine | Admitting: Emergency Medicine

## 2011-01-31 DIAGNOSIS — K089 Disorder of teeth and supporting structures, unspecified: Secondary | ICD-10-CM | POA: Insufficient documentation

## 2011-01-31 DIAGNOSIS — E119 Type 2 diabetes mellitus without complications: Secondary | ICD-10-CM | POA: Insufficient documentation

## 2011-01-31 DIAGNOSIS — K047 Periapical abscess without sinus: Secondary | ICD-10-CM | POA: Insufficient documentation

## 2011-01-31 DIAGNOSIS — I10 Essential (primary) hypertension: Secondary | ICD-10-CM | POA: Insufficient documentation

## 2011-01-31 MED ORDER — BUPIVACAINE-EPINEPHRINE PF 0.5-1:200000 % IJ SOLN
INTRAMUSCULAR | Status: AC
  Filled 2011-01-31: qty 1.8

## 2011-01-31 MED ORDER — BUPIVACAINE-EPINEPHRINE PF 0.5-1:200000 % IJ SOLN
1.8000 mL | Freq: Once | INTRAMUSCULAR | Status: AC
  Administered 2011-01-31: 9 mg

## 2011-01-31 MED ORDER — AMOXICILLIN-POT CLAVULANATE 875-125 MG PO TABS: 1.0000 | ORAL_TABLET | Freq: Two times a day (BID) | ORAL | Status: AC

## 2011-01-31 MED ORDER — OXYCODONE-ACETAMINOPHEN 5-325 MG PO TABS: 2.0000 | ORAL_TABLET | ORAL | Status: AC | PRN

## 2011-01-31 NOTE — ED Notes (Signed)
Pt states seen here last month for same, toothache, unable to f/u with dentist d/t funds. Here today for same.

## 2011-01-31 NOTE — ED Provider Notes (Signed)
History     CSN: [id number] Arrival date & time: 01/31/2011  2:56 PM  Chief Complaint  Patient presents with  . Dental Pain    HPI  (Consider location/radiation/quality/duration/timing/severity/associated sxs/prior treatment)  Patient is a 55 y.o. female presenting with tooth pain.  Dental Pain  55 year old female with diabetes who comes in today complaining of tooth pain. She states that she has an abscess. She states she has had problems with this tooth and was seen here previously and placed on antibiotics. She attempted to go the dentist but did not have enough money to pay the fee and has had worsening of the pain and swelling in this area for the past several days. Speaking or swallowing. She has not had fever or chills. She states her blood sugars are normally under good control. Past Medical History  Diagnosis Date  . Hypertension   . Diabetes mellitus   . Hx of heart bypass surgery     Past Surgical History  Procedure Date  . Cardiac surgery     History reviewed. No pertinent family history.  History  Substance Use Topics  . Smoking status: Current Everyday Smoker  . Smokeless tobacco: Not on file  . Alcohol Use: No    OB History    Grav Para Term Preterm Abortions TAB SAB Ect Mult Living                  Review of Systems  Review of Systems  All other systems reviewed and are negative.    Allergies  Review of patient's allergies indicates no known allergies.  Home Medications   Current Outpatient Rx  Name Route Sig Dispense Refill  . PIOGLITAZONE HCL 45 MG PO TABS Oral Take 45 mg by mouth daily.        Physical Exam    BP 137/73  Pulse 72  Temp 98.2 F (36.8 C)  Resp 16  Ht 5\' 5"  (1.651 m)  Wt 190 lb (86.183 kg)  BMI 31.62 kg/m2  SpO2 100%  Physical Exam  Well-developed well-nourished obese female sitting in bed is not appear to be in any acute distress vital signs are reviewed and are normal HEENT eyes pupils equal round react to  light extraocular movements are intact external ears are normal TMs are normal nares are patent Mouth patient has pain and tenderness over the bottom left second molar. She has swelling of the gum below this area. There is fluctuance noted on palpation. Ears no fullness noted under the tongue or in the submental area. Preauricular nodes are noted. No submandibular nodes are noted. Neck neck is supple trachea is midline carotid pulses are 2+. Lungs are clear to auscultation Heart regular rate and rhythm Abdomen soft and nontender Extremities are normal applied to range of motion. Neural Neurology patient is alert and oriented x3  ED Course  Dental Date/Time: 01/31/2011 3:40 PM Performed by: Shaune Pollack Authorized by: Shaune Pollack Consent: Verbal consent not obtained. Risks and benefits: risks, benefits and alternatives were discussed Consent given by: patient Patient understanding: patient states understanding of the procedure being performed Local anesthesia used: yes Anesthesia: nerve block Local anesthetic: bupivacaine 0.5% with epinephrine Anesthetic total: 2 ml Patient sedated: no Patient tolerance: Patient tolerated the procedure well with no immediate complications. Comments: #11 blade used to incise a size second lower molar. Moderate amount of pus from wound. Patient suction and pus expressed.   (including critical care time)  Labs Reviewed - No data to  display No results found.   No diagnosis found.   Patient we placed on Augmentin it is expressed to the patient the urgency of her need to obtain dental care. She is given Percocet for pain.        Shaune Pollack, MD 01/31/11 [phone]

## 2011-02-12 IMAGING — CR DG CHEST 2V
2 series · 2 of 2 positions shown · non-contrast
Comparison: None

CLINICAL DATA: Cough, fever

CHEST - 2 VIEW

[w chest pa]
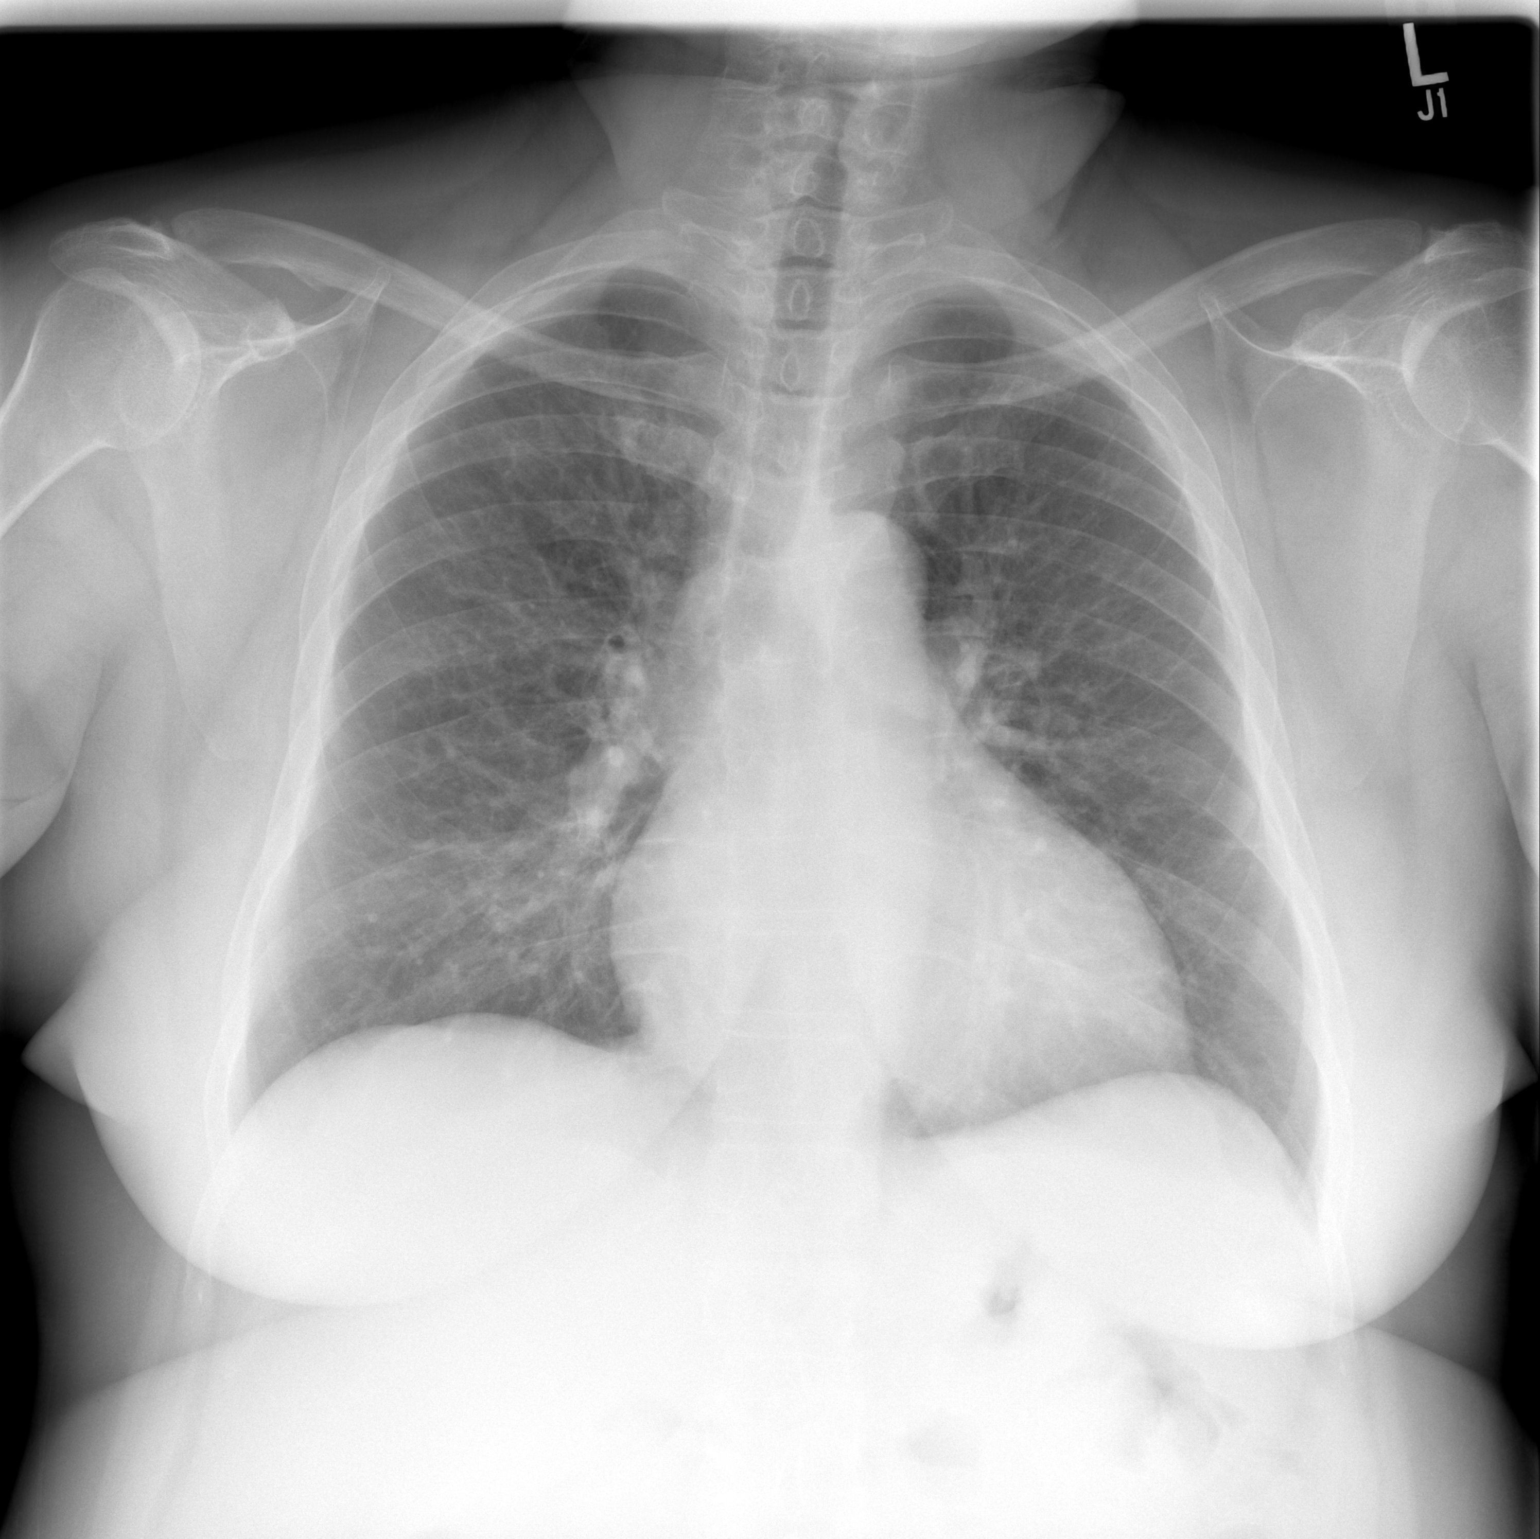

[w chest lat]
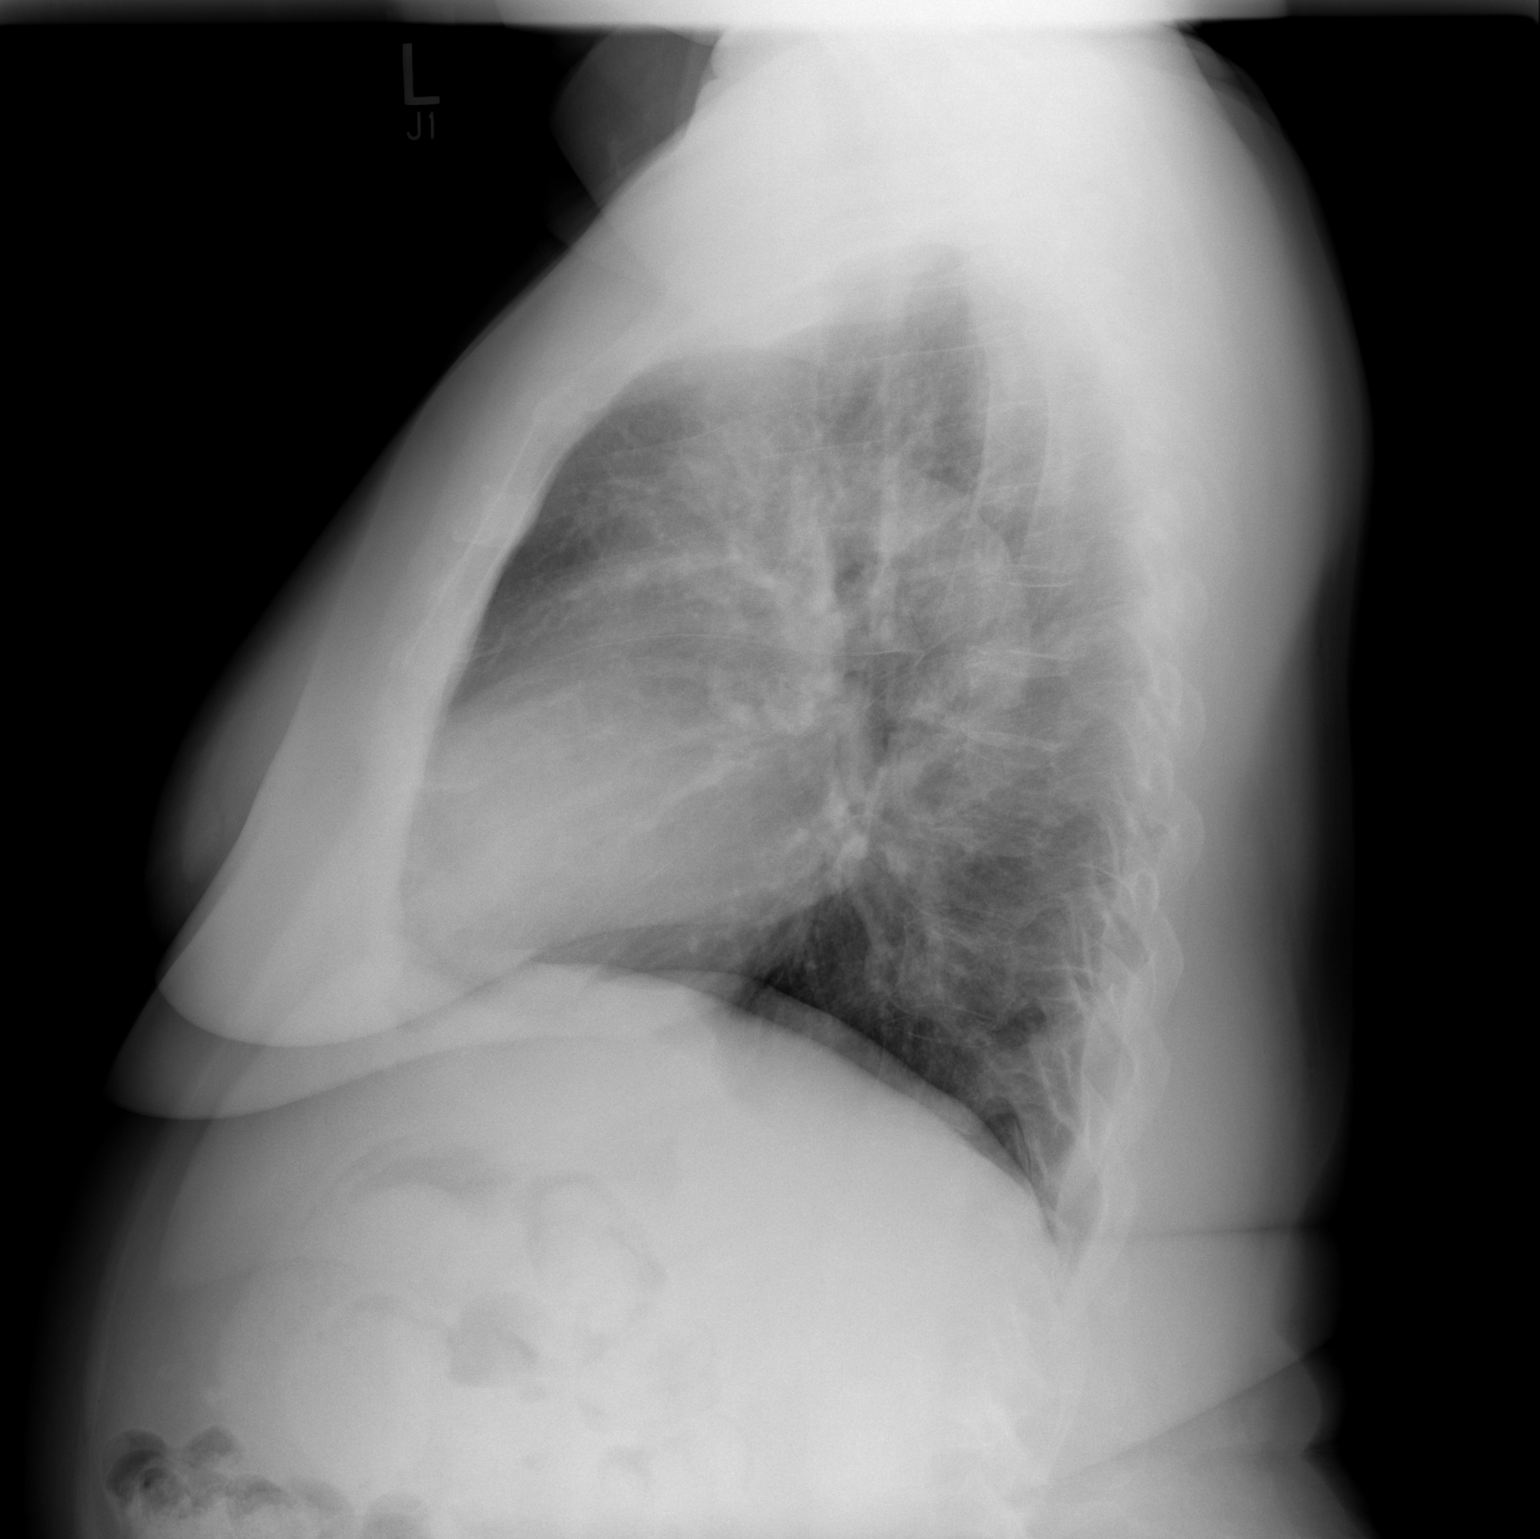

[2 of 2 positions shown; findings below may reference images not displayed]

FINDINGS: No focal infiltrate or effusion is seen.  There are
somewhat prominent interstitial markings most likely chronic in
nature.  There is mild peribronchial thickening noted.  Mild
cardiomegaly is present.  No effusion is seen. No bony abnormality
is seen.
IMPRESSION: 1.  Diffusely prominent interstitial markings most likely chronic
in nature.  Mild peribronchial thickening.
2.  Mild cardiomegaly.

## 2011-05-05 ENCOUNTER — Ambulatory Visit
Admission: RE | Admit: 2011-05-05 | Discharge: 2011-05-05 | Disposition: A | Discharge status: Home / Self Care | Payer: Federal, State, Local not specified - PPO | Source: Ambulatory Visit | Attending: Internal Medicine | Admitting: Internal Medicine

## 2011-05-05 ENCOUNTER — Other Ambulatory Visit: Payer: Self-pay | Admitting: Internal Medicine

## 2011-05-05 DIAGNOSIS — M542 Cervicalgia: Secondary | ICD-10-CM

## 2011-05-17 ENCOUNTER — Other Ambulatory Visit: Payer: Self-pay | Admitting: Internal Medicine

## 2011-05-17 DIAGNOSIS — R9389 Abnormal findings on diagnostic imaging of other specified body structures: Secondary | ICD-10-CM

## 2011-05-17 DIAGNOSIS — Z780 Asymptomatic menopausal state: Secondary | ICD-10-CM

## 2011-05-17 DIAGNOSIS — M5136 Other intervertebral disc degeneration, lumbar region: Secondary | ICD-10-CM

## 2011-05-17 DIAGNOSIS — T8489XA Other specified complication of internal orthopedic prosthetic devices, implants and grafts, initial encounter: Secondary | ICD-10-CM

## 2011-05-29 ENCOUNTER — Other Ambulatory Visit: Payer: Federal, State, Local not specified - PPO

## 2011-06-01 ENCOUNTER — Inpatient Hospital Stay
Admission: RE | Admit: 2011-06-01 | Discharge: 2011-06-01 | Payer: Federal, State, Local not specified - PPO | Source: Ambulatory Visit | Attending: Internal Medicine | Admitting: Internal Medicine

## 2011-06-01 ENCOUNTER — Ambulatory Visit
Admission: RE | Admit: 2011-06-01 | Discharge: 2011-06-01 | Disposition: A | Discharge status: Home / Self Care | Payer: Federal, State, Local not specified - PPO | Source: Ambulatory Visit | Attending: Internal Medicine | Admitting: Internal Medicine

## 2011-06-01 DIAGNOSIS — R9389 Abnormal findings on diagnostic imaging of other specified body structures: Secondary | ICD-10-CM

## 2011-06-07 ENCOUNTER — Ambulatory Visit
Admission: RE | Admit: 2011-06-07 | Discharge: 2011-06-07 | Disposition: A | Discharge status: Home / Self Care | Payer: Federal, State, Local not specified - PPO | Source: Ambulatory Visit | Attending: Internal Medicine | Admitting: Internal Medicine

## 2011-06-07 DIAGNOSIS — Z780 Asymptomatic menopausal state: Secondary | ICD-10-CM

## 2011-08-01 DIAGNOSIS — Z0271 Encounter for disability determination: Secondary | ICD-10-CM

## 2012-01-30 ENCOUNTER — Other Ambulatory Visit: Payer: Self-pay | Admitting: Internal Medicine

## 2012-01-30 DIAGNOSIS — Z1231 Encounter for screening mammogram for malignant neoplasm of breast: Secondary | ICD-10-CM

## 2012-02-06 ENCOUNTER — Ambulatory Visit
Admission: RE | Admit: 2012-02-06 | Discharge: 2012-02-06 | Disposition: A | Discharge status: Home / Self Care | Payer: Federal, State, Local not specified - PPO | Source: Ambulatory Visit | Attending: Internal Medicine | Admitting: Internal Medicine

## 2012-02-06 DIAGNOSIS — Z1231 Encounter for screening mammogram for malignant neoplasm of breast: Secondary | ICD-10-CM

## 2012-05-01 ENCOUNTER — Emergency Department (HOSPITAL_BASED_OUTPATIENT_CLINIC_OR_DEPARTMENT_OTHER): Payer: Federal, State, Local not specified - PPO

## 2012-05-01 ENCOUNTER — Emergency Department (HOSPITAL_BASED_OUTPATIENT_CLINIC_OR_DEPARTMENT_OTHER)
Admission: EM | Admit: 2012-05-01 | Discharge: 2012-05-01 | Disposition: A | Discharge status: Home / Self Care | Payer: Federal, State, Local not specified - PPO | Attending: Emergency Medicine | Admitting: Emergency Medicine

## 2012-05-01 ENCOUNTER — Encounter (HOSPITAL_BASED_OUTPATIENT_CLINIC_OR_DEPARTMENT_OTHER): Payer: Self-pay | Admitting: Emergency Medicine

## 2012-05-01 DIAGNOSIS — L0390 Cellulitis, unspecified: Secondary | ICD-10-CM

## 2012-05-01 DIAGNOSIS — H05019 Cellulitis of unspecified orbit: Secondary | ICD-10-CM | POA: Insufficient documentation

## 2012-05-01 DIAGNOSIS — E119 Type 2 diabetes mellitus without complications: Secondary | ICD-10-CM | POA: Insufficient documentation

## 2012-05-01 DIAGNOSIS — I10 Essential (primary) hypertension: Secondary | ICD-10-CM | POA: Insufficient documentation

## 2012-05-01 DIAGNOSIS — Z79899 Other long term (current) drug therapy: Secondary | ICD-10-CM | POA: Insufficient documentation

## 2012-05-01 DIAGNOSIS — F17200 Nicotine dependence, unspecified, uncomplicated: Secondary | ICD-10-CM | POA: Insufficient documentation

## 2012-05-01 DIAGNOSIS — K047 Periapical abscess without sinus: Secondary | ICD-10-CM | POA: Insufficient documentation

## 2012-05-01 DIAGNOSIS — L0291 Cutaneous abscess, unspecified: Secondary | ICD-10-CM

## 2012-05-01 DIAGNOSIS — Z951 Presence of aortocoronary bypass graft: Secondary | ICD-10-CM | POA: Insufficient documentation

## 2012-05-01 LAB — BASIC METABOLIC PANEL
Calcium: 9.9 mg/dL (ref 8.4–10.5)
Creatinine, Ser: 0.7 mg/dL (ref 0.50–1.10)
GFR calc non Af Amer: >90 mL/min (ref >90)
Glucose, Bld: 183 mg/dL — ABNORMAL HIGH (ref 70–99)
Sodium: 141 mEq/L (ref 135–145)

## 2012-05-01 LAB — CBC
Hemoglobin: 14.4 g/dL (ref 12.0–15.0)
MCH: 30.9 pg (ref 26.0–34.0)
MCHC: 34.1 g/dL (ref 30.0–36.0)
MCV: 90.6 fL (ref 78.0–100.0)
Platelets: 316 10*3/uL (ref 150–400)

## 2012-05-01 MED ORDER — IOHEXOL 350 MG/ML SOLN
100.0000 mL | Freq: Once | INTRAVENOUS | Status: AC | PRN
  Administered 2012-05-01: 100 mL via INTRAVENOUS

## 2012-05-01 MED ORDER — CLINDAMYCIN PHOSPHATE 600 MG/50ML IV SOLN
600.0000 mg | Freq: Once | INTRAVENOUS | Status: AC
  Administered 2012-05-01: 600 mg via INTRAVENOUS
  Filled 2012-05-01: qty 50

## 2012-05-01 MED ORDER — CLINDAMYCIN HCL 300 MG PO CAPS: 300.0000 mg | ORAL_CAPSULE | Freq: Four times a day (QID) | ORAL | Status: DC

## 2012-05-01 NOTE — ED Notes (Signed)
Awoke yesterday am and pt noticed red sore place medial to eye, pt reports drainage from area

## 2012-05-01 NOTE — ED Provider Notes (Signed)
History     CSN: [id number]  Arrival date & time 05/01/12  1323   First MD Initiated Contact with Patient 05/01/12 1336      Chief Complaint  Patient presents with  . Eye Pain    (Consider location/radiation/quality/duration/timing/severity/associated sxs/prior treatment) HPI Pt presents with c/o pain and redness at inner corner of left eye.  She states she had a swollen area there for quite some time and then last night developed redness and pain.  Pain increased today with increased swelling and redness.  She did have some foul smelling drainage from the area.  No fever/chills.  No changes in vision.  No pain in eye proper or pain with eye movements.  There are no other associated systemic symptoms, there are no other alleviating or modifying factors.   Past Medical History  Diagnosis Date  . Hypertension   . Diabetes mellitus   . Hx of heart bypass surgery     Past Surgical History  Procedure Date  . Cardiac surgery     History reviewed. No pertinent family history.  History  Substance Use Topics  . Smoking status: Current Every Day Smoker  . Smokeless tobacco: Not on file  . Alcohol Use: No    OB History    Grav Para Term Preterm Abortions TAB SAB Ect Mult Living                  Review of Systems ROS reviewed and all otherwise negative except for mentioned in HPI  Allergies  Review of patient's allergies indicates no known allergies.  Home Medications   Current Outpatient Rx  Name  Route  Sig  Dispense  Refill  . AMLODIPINE BESYLATE 10 MG PO TABS   Oral   Take 10 mg by mouth daily.         Marland Kitchen HYDROCHLOROTHIAZIDE 25 MG PO TABS   Oral   Take 25 mg by mouth daily.         Marland Kitchen CLINDAMYCIN HCL 300 MG PO CAPS   Oral   Take 1 capsule (300 mg total) by mouth every 6 (six) hours.   28 capsule   0   . PIOGLITAZONE HCL 45 MG PO TABS   Oral   Take 45 mg by mouth daily.             BP 126/58  Pulse 87  Temp 98.1 F (36.7 C) (Oral)  Resp 16   Ht 5\' 5"  (1.651 m)  Wt 200 lb (90.719 kg)  BMI 33.28 kg/m2  SpO2 98% Vitals reviewed Physical Exam Physical Examination: General appearance - alert, well appearing, and in no distress Mental status - alert, oriented to person, place, and time Eyes - pupils equal and reactive, EOMI, no conjunctival injection, mild periorbital erythema approx 1cm area in medial region of left eye, tenderness to palpation Mouth - mucous membranes moist, pharynx normal without lesions Neck - supple, no significant adenopathy Chest - clear to auscultation, no wheezes, rales or rhonchi, symmetric air entry Heart - normal rate, regular rhythm, normal S1, S2, no murmurs, rubs, clicks or gallops Abdomen - soft, nontender, nondistended, no masses or organomegaly Extremities - peripheral pulses normal, no pedal edema, no clubbing or cyanosis Skin - normal coloration and turgor except erythema overlying left medial eye as above ED Course  Procedures (including critical care time)  Labs Reviewed  BASIC METABOLIC PANEL - Abnormal; Notable for the following:    Potassium 3.4 (*)     Glucose,  Bld 183 (*)     All other components within normal limits  CBC   No results found.   1. Abscess and cellulitis       MDM  Pt presenting with c/o redness and pain around left eye.  No pain with movement of eye, no erythema of eye or conjunctival injection.  Will obtain CT orbits to r/o orbital cellulitis.  Pt given IV clindamycin x 1 dose in ED.  CT shows area of pimple but no orbtial cellulitis, not significant enough at this time to drain and the area is also draining pus already.  Pt started on clindamycin and advised to use warm compresses.  Discharged with strict return precautions.  Pt agreeable with plan.       Threasa Beards, MD 05/03/12 [phone]

## 2012-07-07 ENCOUNTER — Emergency Department (HOSPITAL_BASED_OUTPATIENT_CLINIC_OR_DEPARTMENT_OTHER)
Admission: EM | Admit: 2012-07-07 | Discharge: 2012-07-07 | Disposition: A | Discharge status: Home / Self Care | Payer: Federal, State, Local not specified - PPO | Attending: Emergency Medicine | Admitting: Emergency Medicine

## 2012-07-07 ENCOUNTER — Encounter (HOSPITAL_BASED_OUTPATIENT_CLINIC_OR_DEPARTMENT_OTHER): Payer: Self-pay | Admitting: Emergency Medicine

## 2012-07-07 DIAGNOSIS — Z79899 Other long term (current) drug therapy: Secondary | ICD-10-CM | POA: Insufficient documentation

## 2012-07-07 DIAGNOSIS — I10 Essential (primary) hypertension: Secondary | ICD-10-CM | POA: Insufficient documentation

## 2012-07-07 DIAGNOSIS — S39012A Strain of muscle, fascia and tendon of lower back, initial encounter: Secondary | ICD-10-CM

## 2012-07-07 DIAGNOSIS — M129 Arthropathy, unspecified: Secondary | ICD-10-CM | POA: Insufficient documentation

## 2012-07-07 DIAGNOSIS — Y92410 Unspecified street and highway as the place of occurrence of the external cause: Secondary | ICD-10-CM | POA: Insufficient documentation

## 2012-07-07 DIAGNOSIS — Z951 Presence of aortocoronary bypass graft: Secondary | ICD-10-CM | POA: Insufficient documentation

## 2012-07-07 DIAGNOSIS — S335XXA Sprain of ligaments of lumbar spine, initial encounter: Secondary | ICD-10-CM | POA: Insufficient documentation

## 2012-07-07 DIAGNOSIS — F17200 Nicotine dependence, unspecified, uncomplicated: Secondary | ICD-10-CM | POA: Insufficient documentation

## 2012-07-07 DIAGNOSIS — Y9389 Activity, other specified: Secondary | ICD-10-CM | POA: Insufficient documentation

## 2012-07-07 DIAGNOSIS — E119 Type 2 diabetes mellitus without complications: Secondary | ICD-10-CM | POA: Insufficient documentation

## 2012-07-07 HISTORY — DX: Pain, unspecified: R52

## 2012-07-07 HISTORY — DX: Unspecified osteoarthritis, unspecified site: M19.90

## 2012-07-07 HISTORY — DX: Low back pain, unspecified: M54.50

## 2012-07-07 HISTORY — DX: Low back pain: M54.5

## 2012-07-07 HISTORY — DX: Other chronic pain: G89.29

## 2012-07-07 MED ORDER — METHOCARBAMOL 500 MG PO TABS: 500.0000 mg | ORAL_TABLET | Freq: Two times a day (BID) | ORAL | Status: DC | PRN

## 2012-07-07 MED ORDER — MELOXICAM 7.5 MG PO TABS: 15.0000 mg | ORAL_TABLET | Freq: Every day | ORAL | Status: DC

## 2012-07-07 MED ORDER — KETOROLAC TROMETHAMINE 60 MG/2ML IM SOLN
60.0000 mg | Freq: Once | INTRAMUSCULAR | Status: AC
  Administered 2012-07-07: 60 mg via INTRAMUSCULAR
  Filled 2012-07-07: qty 2
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
## 2012-10-09 ENCOUNTER — Emergency Department (HOSPITAL_BASED_OUTPATIENT_CLINIC_OR_DEPARTMENT_OTHER): Payer: Federal, State, Local not specified - PPO

## 2012-10-09 ENCOUNTER — Encounter (HOSPITAL_BASED_OUTPATIENT_CLINIC_OR_DEPARTMENT_OTHER): Payer: Self-pay

## 2012-10-09 ENCOUNTER — Emergency Department (HOSPITAL_BASED_OUTPATIENT_CLINIC_OR_DEPARTMENT_OTHER)
Admission: EM | Admit: 2012-10-09 | Discharge: 2012-10-09 | Disposition: A | Discharge status: Home / Self Care | Payer: Federal, State, Local not specified - PPO | Attending: Emergency Medicine | Admitting: Emergency Medicine

## 2012-10-09 DIAGNOSIS — Y92410 Unspecified street and highway as the place of occurrence of the external cause: Secondary | ICD-10-CM | POA: Insufficient documentation

## 2012-10-09 DIAGNOSIS — E119 Type 2 diabetes mellitus without complications: Secondary | ICD-10-CM | POA: Insufficient documentation

## 2012-10-09 DIAGNOSIS — I10 Essential (primary) hypertension: Secondary | ICD-10-CM | POA: Insufficient documentation

## 2012-10-09 DIAGNOSIS — M129 Arthropathy, unspecified: Secondary | ICD-10-CM | POA: Insufficient documentation

## 2012-10-09 DIAGNOSIS — Z79899 Other long term (current) drug therapy: Secondary | ICD-10-CM | POA: Insufficient documentation

## 2012-10-09 DIAGNOSIS — F17200 Nicotine dependence, unspecified, uncomplicated: Secondary | ICD-10-CM | POA: Insufficient documentation

## 2012-10-09 DIAGNOSIS — S79919A Unspecified injury of unspecified hip, initial encounter: Secondary | ICD-10-CM | POA: Insufficient documentation

## 2012-10-09 DIAGNOSIS — S0993XA Unspecified injury of face, initial encounter: Secondary | ICD-10-CM | POA: Insufficient documentation

## 2012-10-09 DIAGNOSIS — Y9389 Activity, other specified: Secondary | ICD-10-CM | POA: Insufficient documentation

## 2012-10-09 DIAGNOSIS — Z951 Presence of aortocoronary bypass graft: Secondary | ICD-10-CM | POA: Insufficient documentation

## 2012-10-09 DIAGNOSIS — IMO0002 Reserved for concepts with insufficient information to code with codable children: Secondary | ICD-10-CM | POA: Insufficient documentation

## 2012-10-09 DIAGNOSIS — Z880 Allergy status to penicillin: Secondary | ICD-10-CM | POA: Insufficient documentation

## 2012-10-09 MED ORDER — OXYCODONE-ACETAMINOPHEN 10-325 MG PO TABS: 1.0000 | ORAL_TABLET | Freq: Three times a day (TID) | ORAL | Status: DC | PRN

## 2012-10-09 MED ORDER — HYDROMORPHONE HCL PF 1 MG/ML IJ SOLN
1.0000 mg | Freq: Once | INTRAMUSCULAR | Status: AC
  Administered 2012-10-09: 1 mg via INTRAMUSCULAR
  Filled 2012-10-09: qty 1

## 2012-10-09 MED ORDER — DIAZEPAM 5 MG PO TABS: 5.0000 mg | ORAL_TABLET | Freq: Two times a day (BID) | ORAL | Status: DC

## 2012-10-09 MED ORDER — MELOXICAM 7.5 MG PO TABS: 15.0000 mg | ORAL_TABLET | Freq: Every day | ORAL | Status: DC

## 2012-10-09 NOTE — ED Notes (Signed)
Patient transported to X-ray 
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
## 2012-10-09 NOTE — ED Notes (Signed)
MD at bedside. 
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
## 2013-02-11 ENCOUNTER — Emergency Department (HOSPITAL_BASED_OUTPATIENT_CLINIC_OR_DEPARTMENT_OTHER)
Admission: EM | Admit: 2013-02-11 | Discharge: 2013-02-11 | Disposition: A | Discharge status: Home / Self Care | Payer: Federal, State, Local not specified - PPO | Attending: Emergency Medicine | Admitting: Emergency Medicine

## 2013-02-11 ENCOUNTER — Encounter (HOSPITAL_BASED_OUTPATIENT_CLINIC_OR_DEPARTMENT_OTHER): Payer: Self-pay | Admitting: *Deleted

## 2013-02-11 DIAGNOSIS — E119 Type 2 diabetes mellitus without complications: Secondary | ICD-10-CM | POA: Insufficient documentation

## 2013-02-11 DIAGNOSIS — Z79899 Other long term (current) drug therapy: Secondary | ICD-10-CM | POA: Insufficient documentation

## 2013-02-11 DIAGNOSIS — Z880 Allergy status to penicillin: Secondary | ICD-10-CM | POA: Insufficient documentation

## 2013-02-11 DIAGNOSIS — R0602 Shortness of breath: Secondary | ICD-10-CM | POA: Insufficient documentation

## 2013-02-11 DIAGNOSIS — F17200 Nicotine dependence, unspecified, uncomplicated: Secondary | ICD-10-CM | POA: Insufficient documentation

## 2013-02-11 DIAGNOSIS — R002 Palpitations: Secondary | ICD-10-CM | POA: Insufficient documentation

## 2013-02-11 DIAGNOSIS — G8929 Other chronic pain: Secondary | ICD-10-CM | POA: Insufficient documentation

## 2013-02-11 DIAGNOSIS — Z8739 Personal history of other diseases of the musculoskeletal system and connective tissue: Secondary | ICD-10-CM | POA: Insufficient documentation

## 2013-02-11 DIAGNOSIS — Z951 Presence of aortocoronary bypass graft: Secondary | ICD-10-CM | POA: Insufficient documentation

## 2013-02-11 DIAGNOSIS — I10 Essential (primary) hypertension: Secondary | ICD-10-CM | POA: Insufficient documentation

## 2013-02-11 LAB — BASIC METABOLIC PANEL
Calcium: 10 mg/dL (ref 8.4–10.5)
GFR calc Af Amer: >90 mL/min (ref >90)
GFR calc non Af Amer: >90 mL/min (ref >90)
Glucose, Bld: 282 mg/dL — ABNORMAL HIGH (ref 70–99)
Potassium: 3.7 mEq/L (ref 3.5–5.1)
Sodium: 139 mEq/L (ref 135–145)

## 2013-02-11 LAB — CBC WITH DIFFERENTIAL/PLATELET
Basophils Absolute: 0 10*3/uL (ref 0.0–0.1)
Basophils Relative: 0 % (ref 0–1)
Eosinophils Absolute: 0.1 10*3/uL (ref 0.0–0.7)
Eosinophils Relative: 2 % (ref 0–5)
Lymphs Abs: 2.5 10*3/uL (ref 0.7–4.0)
MCH: 30.5 pg (ref 26.0–34.0)
MCV: 91.4 fL (ref 78.0–100.0)
Neutrophils Relative %: 58 % (ref 43–77)
Platelets: 282 10*3/uL (ref 150–400)
RBC: 4.53 MIL/uL (ref 3.87–5.11)
RDW: 13.1 % (ref 11.5–15.5)

## 2013-02-11 LAB — TROPONIN I: Troponin I: <0.3 ng/mL (ref <0.30)

## 2013-02-11 NOTE — ED Notes (Signed)
Pt ambulating independently w/ steady gait on d/c in no acute distress, A&Ox4.D/c instructions reviewed w/ pt and family - pt and family deny any further questions or concerns at present.  

## 2013-02-11 NOTE — ED Notes (Addendum)
Pt c/o palpitations, SOB  x 4 days pt is out of bystolic  , sched stress test 10/20 pt denies CP

## 2013-02-11 NOTE — ED Provider Notes (Signed)
CSN: [id number]     Arrival date & time 02/11/13  1748 History  This chart was scribed for Derrel Moore B. Karle Starch, MD by Zettie Pho, ED Scribe. This patient was seen in room MH10/MH10 and the patient's care was started at 6:28 PM.    Chief Complaint  Patient presents with  . Palpitations   The history is provided by the patient. No language interpreter was used.   HPI Comments: Gail Humphrey is a 57 y.o. female who presents to the Emergency Department complaining of heart palpitations that she describes as feeling like a "real quick quiver" with associated shortness of breath onset 1 month ago that usually wakes her from sleep and lasts for a couple of seconds, last episode this morning. She reports that she is scheduled for a stress test on 02/24/2013. She reports that she ran out of her Bystolic medication 4 days ago. Patient has a history of bypass cardiac surgery in September, 2011 (performed by Dr. Koleen Nimrod). Patient reports that she does not have a cardiologist. Denies any CP with these episodes.   Past Medical History  Diagnosis Date  . Hypertension   . Diabetes mellitus   . Hx of heart bypass surgery   . Chronic lower back pain   . Arthritis   . Pain management    Past Surgical History  Procedure Laterality Date  . Cardiac surgery     History reviewed. No pertinent family history. History  Substance Use Topics  . Smoking status: Current Every Day Smoker -- 0.50 packs/day    Types: Cigarettes  . Smokeless tobacco: Not on file  . Alcohol Use: Yes   OB History   Grav Para Term Preterm Abortions TAB SAB Ect Mult Living                 Review of Systems  A complete 10 system review of systems was obtained and all systems are negative except as noted in the HPI and PMH.   Allergies  Gabapentin and Penicillins  Home Medications   Current Outpatient Rx  Name  Route  Sig  Dispense  Refill  . nebivolol (BYSTOLIC) 2.5 MG tablet   Oral   Take 2.5 mg by mouth daily.        . pioglitazone (ACTOS) 45 MG tablet   Oral   Take 45 mg by mouth daily.           Marland Kitchen UNKNOWN TO PATIENT      High cholesterol med          Triage Vitals: BP 146/83  Pulse 63  Temp(Src) 98.8 F (37.1 C) (Oral)  Resp 16  Ht 5\' 5"  (1.651 m)  Wt 200 lb (90.719 kg)  BMI 33.28 kg/m2  SpO2 97%  Physical Exam  Nursing note and vitals reviewed. Constitutional: She is oriented to person, place, and time. She appears well-developed and well-nourished.  HENT:  Head: Normocephalic and atraumatic.  Eyes: EOM are normal. Pupils are equal, round, and reactive to light.  Neck: Normal range of motion. Neck supple.  Cardiovascular: Normal rate, regular rhythm, normal heart sounds and intact distal pulses.   Pulmonary/Chest: Effort normal and breath sounds normal.  Abdominal: Soft. Bowel sounds are normal. There is no tenderness.  Musculoskeletal: Normal range of motion. She exhibits no edema and no tenderness.  Neurological: She is alert and oriented to person, place, and time. She has normal strength. No cranial nerve deficit or sensory deficit.  Skin: Skin is warm and dry. No  rash noted.  Psychiatric: She has a normal mood and affect.    ED Course  Procedures (including critical care time)  DIAGNOSTIC STUDIES: Oxygen Saturation is 97% on room air, normal by my interpretation.    COORDINATION OF CARE: 6:43PM- Discussed that concern for a pulmonary embolism is low. Will refer patient to a cardiologist and advised her to follow up for at-home cardiac monitoring. Will order blood labs. Discussed treatment plan with patient at bedside and patient verbalized agreement.    Labs Review Labs Reviewed  BASIC METABOLIC PANEL - Abnormal; Notable for the following:    Glucose, Bld 282 (*)    All other components within normal limits  CBC WITH DIFFERENTIAL  TROPONIN I   Imaging Review No results found.  MDM   1. Palpitations      Date: 02/11/2013  Rate: 64  Rhythm: normal  sinus rhythm  QRS Axis: normal  Intervals: QT prolonged  ST/T Wave abnormalities: nonspecific T wave changes  Conduction Disutrbances:none  Narrative Interpretation:   Old EKG Reviewed: unchanged  Pt remains asymptomatic in the ED, observed on monitor without dysrhythmia. Labs reviewed and unremarkable. Pt comfortable going home advised PCP followup.   I personally performed the services described in this documentation, which was scribed in my presence. The recorded information has been reviewed and is accurate.      Myleen Brailsford B. Karle Starch, MD 02/11/13 2052

## 2013-04-16 DIAGNOSIS — I10 Essential (primary) hypertension: Secondary | ICD-10-CM | POA: Insufficient documentation

## 2013-04-16 DIAGNOSIS — G8929 Other chronic pain: Secondary | ICD-10-CM | POA: Insufficient documentation

## 2013-07-28 DIAGNOSIS — M5137 Other intervertebral disc degeneration, lumbosacral region: Secondary | ICD-10-CM | POA: Insufficient documentation

## 2013-07-28 DIAGNOSIS — M5416 Radiculopathy, lumbar region: Secondary | ICD-10-CM | POA: Insufficient documentation

## 2013-07-28 DIAGNOSIS — M48061 Spinal stenosis, lumbar region without neurogenic claudication: Secondary | ICD-10-CM | POA: Insufficient documentation

## 2015-01-02 ENCOUNTER — Emergency Department (HOSPITAL_BASED_OUTPATIENT_CLINIC_OR_DEPARTMENT_OTHER): Payer: Federal, State, Local not specified - PPO

## 2015-01-02 ENCOUNTER — Emergency Department (HOSPITAL_BASED_OUTPATIENT_CLINIC_OR_DEPARTMENT_OTHER)
Admission: EM | Admit: 2015-01-02 | Discharge: 2015-01-02 | Disposition: A | Discharge status: Home / Self Care | Payer: Federal, State, Local not specified - PPO | Attending: Physician Assistant | Admitting: Physician Assistant

## 2015-01-02 ENCOUNTER — Encounter (HOSPITAL_BASED_OUTPATIENT_CLINIC_OR_DEPARTMENT_OTHER): Payer: Self-pay | Admitting: *Deleted

## 2015-01-02 DIAGNOSIS — Z79899 Other long term (current) drug therapy: Secondary | ICD-10-CM | POA: Insufficient documentation

## 2015-01-02 DIAGNOSIS — M25512 Pain in left shoulder: Secondary | ICD-10-CM

## 2015-01-02 DIAGNOSIS — E119 Type 2 diabetes mellitus without complications: Secondary | ICD-10-CM | POA: Diagnosis not present

## 2015-01-02 DIAGNOSIS — M79602 Pain in left arm: Secondary | ICD-10-CM | POA: Insufficient documentation

## 2015-01-02 DIAGNOSIS — Z9889 Other specified postprocedural states: Secondary | ICD-10-CM | POA: Diagnosis not present

## 2015-01-02 DIAGNOSIS — F1310 Sedative, hypnotic or anxiolytic abuse, uncomplicated: Secondary | ICD-10-CM | POA: Insufficient documentation

## 2015-01-02 DIAGNOSIS — Z880 Allergy status to penicillin: Secondary | ICD-10-CM | POA: Diagnosis not present

## 2015-01-02 DIAGNOSIS — R079 Chest pain, unspecified: Secondary | ICD-10-CM

## 2015-01-02 DIAGNOSIS — I10 Essential (primary) hypertension: Secondary | ICD-10-CM | POA: Insufficient documentation

## 2015-01-02 DIAGNOSIS — G8929 Other chronic pain: Secondary | ICD-10-CM | POA: Diagnosis not present

## 2015-01-02 DIAGNOSIS — Z720 Tobacco use: Secondary | ICD-10-CM | POA: Insufficient documentation

## 2015-01-02 LAB — BASIC METABOLIC PANEL
Anion gap: 13 (ref 5–15)
BUN: 14 mg/dL (ref 6–20)
CHLORIDE: 102 mmol/L (ref 101–111)
CO2: 27 mmol/L (ref 22–32)
Calcium: 9.5 mg/dL (ref 8.9–10.3)
Creatinine, Ser: 0.69 mg/dL (ref 0.44–1.00)
GFR calc Af Amer: >60 mL/min (ref >60)
GFR calc non Af Amer: >60 mL/min (ref >60)
Glucose, Bld: 240 mg/dL — ABNORMAL HIGH (ref 65–99)
POTASSIUM: 3.4 mmol/L — AB (ref 3.5–5.1)
SODIUM: 142 mmol/L (ref 135–145)

## 2015-01-02 LAB — URINALYSIS, ROUTINE W REFLEX MICROSCOPIC
BILIRUBIN URINE: NEGATIVE
Glucose, UA: 100 mg/dL — AB
HGB URINE DIPSTICK: NEGATIVE
KETONES UR: NEGATIVE mg/dL
Nitrite: NEGATIVE
PROTEIN: NEGATIVE mg/dL
SPECIFIC GRAVITY, URINE: 1.01 (ref 1.005–1.030)
UROBILINOGEN UA: 0.2 mg/dL (ref 0.0–1.0)
pH: 6.5 (ref 5.0–8.0)

## 2015-01-02 LAB — CBC WITH DIFFERENTIAL/PLATELET
BASOS PCT: 1 % (ref 0–1)
Basophils Absolute: 0 10*3/uL (ref 0.0–0.1)
Eosinophils Absolute: 0.1 10*3/uL (ref 0.0–0.7)
Eosinophils Relative: 2 % (ref 0–5)
HEMATOCRIT: 42.9 % (ref 36.0–46.0)
HEMOGLOBIN: 14.3 g/dL (ref 12.0–15.0)
LYMPHS ABS: 2.6 10*3/uL (ref 0.7–4.0)
LYMPHS PCT: 40 % (ref 12–46)
MCH: 30.6 pg (ref 26.0–34.0)
MCHC: 33.3 g/dL (ref 30.0–36.0)
MCV: 91.9 fL (ref 78.0–100.0)
MONO ABS: 0.6 10*3/uL (ref 0.1–1.0)
MONOS PCT: 10 % (ref 3–12)
NEUTROS ABS: 3.2 10*3/uL (ref 1.7–7.7)
NEUTROS PCT: 48 % (ref 43–77)
Platelets: 317 10*3/uL (ref 150–400)
RBC: 4.67 MIL/uL (ref 3.87–5.11)
RDW: 13.2 % (ref 11.5–15.5)
WBC: 6.6 10*3/uL (ref 4.0–10.5)

## 2015-01-02 LAB — TROPONIN I

## 2015-01-02 LAB — RAPID URINE DRUG SCREEN, HOSP PERFORMED
Amphetamines: NOT DETECTED
BARBITURATES: NOT DETECTED
Benzodiazepines: POSITIVE — AB
Cocaine: NOT DETECTED
Opiates: NOT DETECTED
TETRAHYDROCANNABINOL: NOT DETECTED

## 2015-01-02 LAB — URINE MICROSCOPIC-ADD ON

## 2015-01-02 LAB — ETHANOL: ALCOHOL ETHYL (B): 79 mg/dL — AB (ref <5)

## 2015-01-02 MED ORDER — ASPIRIN 81 MG PO CHEW
324.0000 mg | CHEWABLE_TABLET | Freq: Once | ORAL | Status: AC
  Administered 2015-01-02: 324 mg via ORAL
  Filled 2015-01-02: qty 4

## 2015-01-02 MED ORDER — NITROGLYCERIN 2 % TD OINT
1.0000 [in_us] | TOPICAL_OINTMENT | Freq: Once | TRANSDERMAL | Status: AC
  Administered 2015-01-02: 1 [in_us] via TOPICAL
  Filled 2015-01-02: qty 1

## 2015-01-02 MED ORDER — KETOROLAC TROMETHAMINE 30 MG/ML IJ SOLN
30.0000 mg | Freq: Once | INTRAMUSCULAR | Status: AC
  Administered 2015-01-02: 30 mg via INTRAVENOUS
  Filled 2015-01-02: qty 1

## 2015-01-02 MED ORDER — HYDRALAZINE HCL 20 MG/ML IJ SOLN
10.0000 mg | INTRAMUSCULAR | Status: AC
  Administered 2015-01-02: 10 mg via INTRAVENOUS
  Filled 2015-01-02: qty 1

## 2015-01-02 NOTE — ED Provider Notes (Signed)
CSN: [id number]     Arrival date & time 01/02/15  1500 History   First MD Initiated Contact with Patient 01/02/15 1530     Chief Complaint  Patient presents with  . Chest Pain     (Consider location/radiation/quality/duration/timing/severity/associated sxs/prior Treatment) The history is provided by the patient and medical records. No language interpreter was used.     Gail Humphrey is a 59 y.o. female  with a hx of HTN, NIDDM, CABG, chronic lower back pain presents to the Emergency Department complaining of gradual, persistent, progressively worsening chest pain with radiation to the left neck and left shoulder and left lower arm onset approximately 10:30 AM this morning.  Patient reports he began 30 minutes after taking her losartan. She reports she often has chest pain after taking losartan however she has not addressed this with her family physician or cardiologist.  Patient is a current everyday smoker. She reports she drinks wine including this morning and also took a Xanax this morning.  She reports that she was begun on the losartan in May 2016 and has had intermittent chest pain since that time. She reports that movement, palpation and exertion have no effect on her pain. She had associated diaphoresis but no nausea, near syncope or shortness of breath.  Patient denies history of MI.  Patient denies recent travel, swelling of her legs, recent fractures or surgery, exogenous estrogen usage, history of DVT.  Primary care - [facility] - pt cannot remember   Past Medical History  Diagnosis Date  . Hypertension   . Diabetes mellitus   . Hx of heart bypass surgery   . Chronic lower back pain   . Arthritis   . Pain management    Past Surgical History  Procedure Laterality Date  . Cardiac surgery    . Coronary artery bypass graft  4 vessel   No family history on file. Social History  Substance Use Topics  . Smoking status: Current  Every Day Smoker -- 0.50 packs/day    Types: Cigarettes  . Smokeless tobacco: Never Used  . Alcohol Use: Yes     Comment: wine/ 3x week   OB History    No data available     Review of Systems  Constitutional: Positive for diaphoresis. Negative for fever, appetite change, fatigue and unexpected weight change.  HENT: Negative for mouth sores.   Eyes: Negative for visual disturbance.  Respiratory: Negative for cough, chest tightness, shortness of breath and wheezing.   Cardiovascular: Positive for chest pain.  Gastrointestinal: Negative for nausea, vomiting, abdominal pain, diarrhea and constipation.  Endocrine: Negative for polydipsia, polyphagia and polyuria.  Genitourinary: Negative for dysuria, urgency, frequency and hematuria.  Musculoskeletal: Positive for arthralgias (left arm). Negative for back pain and neck stiffness.  Skin: Negative for rash.  Allergic/Immunologic: Negative for immunocompromised state.  Neurological: Negative for syncope, light-headedness and headaches.  Hematological: Does not bruise/bleed easily.  Psychiatric/Behavioral: Negative for sleep disturbance. The patient is not nervous/anxious.       Allergies  Gabapentin and Penicillins  Home Medications   Prior to Admission medications   Medication Sig Start Date End Date Taking? Authorizing Provider  losartan (COZAAR) 50 MG tablet Take 50 mg by mouth daily.   Yes Historical Provider, MD  metFORMIN (GLUCOPHAGE) 1000 MG tablet Take 1,000 mg by mouth 2 (two) times daily with a meal.   Yes Historical Provider, MD  nebivolol (BYSTOLIC) 2.5 MG tablet Take 2.5 mg by mouth daily.  Historical Provider, MD  pioglitazone (ACTOS) 45 MG tablet Take 45 mg by mouth daily.      Historical Provider, MD  UNKNOWN TO PATIENT High cholesterol med    Historical Provider, MD   BP 191/82 mmHg  Pulse 87  Temp(Src) 98.8 F (37.1 C) (Oral)  Resp 22  Ht 5\' 5"  (1.651 m)  Wt 194 lb (87.998 kg)  BMI 32.28 kg/m2  SpO2  97% Physical Exam  Constitutional: She appears well-developed and well-nourished. No distress.  Awake, alert, nontoxic appearance  HENT:  Head: Normocephalic and atraumatic.  Mouth/Throat: Oropharynx is clear and moist. No oropharyngeal exudate.  Eyes: Conjunctivae are normal. No scleral icterus.  Neck: Normal range of motion. Neck supple.  Cardiovascular: Normal rate, regular rhythm, normal heart sounds and intact distal pulses.   Pulmonary/Chest: Effort normal and breath sounds normal. No respiratory distress. She has no wheezes. She exhibits no tenderness.  Equal chest expansion  Abdominal: Soft. Bowel sounds are normal. She exhibits no distension and no mass. There is no tenderness. There is no rebound and no guarding.  Musculoskeletal: Normal range of motion. She exhibits no edema.  Slightly limited range of motion of the left shoulder which worsens CP and neck pain   Neurological: She is alert.  Speech is clear and goal oriented Moves extremities without ataxia 4/5 flexion, extension, abduction and adduction of the left shoulder  Skin: Skin is warm and dry. No rash noted. She is not diaphoretic. No erythema.  Psychiatric: She has a normal mood and affect.  Nursing note and vitals reviewed.   ED Course  Procedures (including critical care time) Labs Review Labs Reviewed  BASIC METABOLIC PANEL - Abnormal; Notable for the following:    Potassium 3.4 (*)    Glucose, Bld 240 (*)    All other components within normal limits  URINE RAPID DRUG SCREEN, HOSP PERFORMED - Abnormal; Notable for the following:    Benzodiazepines POSITIVE (*)    All other components within normal limits  ETHANOL - Abnormal; Notable for the following:    Alcohol, Ethyl (B) 79 (*)    All other components within normal limits  URINALYSIS, ROUTINE W REFLEX MICROSCOPIC (NOT AT [facility]) - Abnormal; Notable for the following:    Glucose, UA 100 (*)    Leukocytes, UA TRACE (*)    All other components within  normal limits  CBC WITH DIFFERENTIAL/PLATELET  TROPONIN I  TROPONIN I  URINE MICROSCOPIC-ADD ON    Imaging Review Dg Chest 2 View  01/02/2015   CLINICAL DATA:  Chest pain. Left chest pain radiating to left arm. Shortness of breath.  EXAM: CHEST  2 VIEW  COMPARISON:  10/09/2012  FINDINGS: Lateral view degraded by patient arm position. Prior median sternotomy. Midline trachea. Borderline cardiomegaly. Mediastinal contours otherwise within normal limits. No pleural effusion or pneumothorax. Development of mild pulmonary interstitial prominence. No lobar consolidation.  IMPRESSION: Borderline cardiomegaly with development of mild pulmonary interstitial prominence. Cannot exclude mild pulmonary venous congestion. No overt congestive failure.   Electronically Signed   By: Abigail Miyamoto M.D.   On: 01/02/2015 16:16   I have personally reviewed and evaluated these images and lab results as part of my medical decision-making.   EKG Interpretation   Date/Time:  Saturday January 02 2015 15:07:19 EDT Ventricular Rate:  93 PR Interval:  170 QRS Duration: 96 QT Interval:  386 QTC Calculation: 479 R Axis:   -46 Text Interpretation:  Normal sinus rhythm Pulmonary disease pattern Left  anterior  fascicular block T wave abnormality, consider lateral ischemia  Abnormal ECG no acute ischemia.  Confirmed by Gerald Leitz ([id number]) on  01/02/2015 3:43:47 PM      MDM   Final diagnoses:  Chest pain, unspecified chest pain type  Left shoulder pain  Essential hypertension   Royalty Stefanowicz presents with chest pain that radiates into the left neck, shoulder and arm without passing the elbow.  Pt with Hx of CABG.  Pt reports taking Xanax this AM.  Will begin ACS work-up.    8:12 PM Patient is to be discharged with recommendation to follow up with PCP and cardiology within 48 hours in regards to today's hospital visit. Chest pain unlikely to be ACS, PE d/t presentation, PERC negative, VSS, no tracheal  deviation, no JVD or new murmur, RRR, breath sounds equal bilaterally, EKG without acute ischemia, negative troponin x2, and negative CXR. Pt's BP has improved.  He pain is improved and is localized only in her shoulder at this time.  Pt has been advised to return to the ED if CP becomes exertional, associated with diaphoresis or nausea, radiates to left jaw/arm, worsens or becomes concerning in any way. Pt appears reliable for follow up and is agreeable to discharge.   Case has been discussed with and seen by Dr. Thomasene Lot who agrees with the above plan to discharge.    Jarrett Soho Nakota Elsen, PA-C 01/02/15 2016  Courteney Julio Alm, MD 01/03/15 1459

## 2015-01-02 NOTE — ED Notes (Signed)
Nitro paste removed prior to D/C

## 2015-01-02 NOTE — ED Notes (Signed)
Pt reports chest pain behind left breast, also in neck and left arm- diaphoretic- states she gets this pain whenever she takes her losartan

## 2015-01-02 NOTE — Discharge Instructions (Signed)
1. Medications: usual home medications 2. Treatment: rest, drink plenty of fluids,  3. Follow Up: Please followup with your primary doctor in 2 days for discussion of your diagnoses and further evaluation after today's visit; if you do not have a primary care doctor use the resource guide provided to find one; Please return to the ER for worsening symptoms, chest pain that is associated with near syncope, nausea, vomiting or other concerning symptoms     Emergency Department Resource Guide 1) Find a Doctor and Pay Out of Pocket Although you won't have to find out who is covered by your insurance plan, it is a good idea to ask around and get recommendations. You will then need to call the office and see if the doctor you have chosen will accept you as a new patient and what types of options they offer for patients who are self-pay. Some doctors offer discounts or will set up payment plans for their patients who do not have insurance, but you will need to ask so you aren't surprised when you get to your appointment.  2) Contact Your Local Health Department Not all health departments have doctors that can see patients for sick visits, but many do, so it is worth a call to see if yours does. If you don't know where your local health department is, you can check in your phone book. The CDC also has a tool to help you locate your state's health department, and many state websites also have listings of all of their local health departments.  3) Find a [facility] If your illness is not likely to be very severe or complicated, you may want to try a walk in clinic. These are popping up all over the country in pharmacies, drugstores, and shopping centers. They're usually staffed by nurse practitioners or physician assistants that have been trained to treat common illnesses and complaints. They're usually fairly quick and inexpensive. However, if you have serious medical issues or chronic medical problems,  these are probably not your best option.  No Primary Care Doctor: - Call Health Connect at  [phone] - they can help you locate a primary care doctor that  accepts your insurance, provides certain services, etc. - Physician Referral Service- [phone]  Chronic Pain Problems: Organization         Address  Phone   Notes  [facility]  [phone] Patients need to be referred by their primary care doctor.   Medication Assistance: Organization         Address  Phone   Notes  [facility] Medication [facility] [address] [place], [place] [address] [phone] --Must be a resident of [facility] -- Must have NO insurance coverage whatsoever (no Medicaid/ Medicare, etc.) -- The pt. MUST have a primary care doctor that directs their care regularly and follows them in the community   MedAssist  [phone]   [employer]  [phone]    Agencies that provide inexpensive medical care: Organization         Address  Phone   Notes  [place]  [phone]   [facility]    [phone]   [facility] [place], [place] [address] [phone]   [place] [address], [place] [phone]   Planned Parenthood    [phone]   [facility]    [phone])  [place]  [address], [place] Phone:  [phone], Fax:  [phone] Hours of Operation:  9 am - 6 pm, M-F.  Also accepts Medicaid/Medicare and self-pay.  [facility] for [place], [address], [place] Phone: [phone], Fax: [phone]. Hours of Operation:  8:30 am - 5:30 pm, M-F.  Also accepts Medicaid and self-pay.  [facility] [place] [address] [place] Phone: [phone]   [place], [place], [place] [phone], Ext. [phone] Mondays &  Thursdays: 7-9 AM.  First 15 patients are seen on a first come, first serve basis.    [place] Providers:  Organization         Address  Phone   Notes  [facility] [address], [place] [phone] Also accepts self-pay patients.  [facility] [id number] [place], [place]  [phone]   [place], [address], [place] [phone]   [facility] [address] [place] [phone]   Lucianne Lei [address], [place]   [phone] Only accepts [place] Access [place] patients after they have their name applied to their card.   Self-Pay (no insurance) in [facility]:  Organization         Address  Phone   Notes  Sickle Cell Patients, [facility] [place] [phone]   [facility] [place] [phone]   [facility] [place]  [place], [place], [place] [phone]   [facility]/Dr. Osei-Bonsu  [address], [place] or [address], [place] [phone] Phone number for both [place] and [place] locations is the same.  [facility] [address], [place] [phone]   [facility] [address], [place] or [address] [phone]   [facility] [address] [place] [phone], phone; [phone], fax Sees patients 1st and 3rd Saturday of every month.  Must not qualify for public or private insurance (i.e. Medicaid, Medicare, [place] Health Choice, Veterans' Benefits)  Household income should be no more than 200% of the poverty level The clinic cannot treat you if you are pregnant or think you are pregnant  Sexually transmitted diseases are not treated at the  clinic.    Dental Care: Organization         Address  Phone  Notes  [facility] Department of [facility] [place] [phone] Accepts children up to age 41 who are enrolled in [place] or [place]; pregnant women with a Medicaid card; and children who have applied for Medicaid or [place] Health Choice, but were declined, whose parents can pay a reduced fee at time of service.  [facility] Department of [facility]  [address], [place] [phone] Accepts children up to age 15 who are enrolled in [place] or [place]; pregnant women with a Medicaid card; and children who have applied for Medicaid or [place] Health Choice, but were declined, whose parents can pay a reduced fee at time of service.  [place] Adult Dental Access  PROGRAM  [place] [phone] Patients are seen by appointment only. Walk-ins are not accepted. [place] will see patients 63 years of age and older. Monday - Tuesday (8am-5pm) Most Wednesdays (8:30-5pm) $30 per visit, cash only  [facility] Adult Dental Access PROGRAM  [address], [facility] [phone] Patients are seen by appointment only. Walk-ins are not accepted. [place] will see patients 39 years of age and older. One Wednesday Evening (Monthly: Volunteer Based).  $30 per visit, cash only  [place]  [phone] for adults; Children under age 63, call Graduate Pediatric Dentistry at [phone]. Children aged 87-14, please call [phone] to request a pediatric application.  Dental services are provided in all areas of dental care including fillings, crowns and bridges, complete and partial dentures, implants, gum treatment, root canals, and extractions. Preventive care is also provided. Treatment is provided to both adults and children. Patients are selected via a lottery and there is often a  waiting list.   [facility] [address], [place]  [phone] [email or web address]   [facility] [address] [place], [place] [phone], Ext. [phone] Second and Fourth Thursday of each month, opens at 6:30 AM; Clinic ends at 9 AM.  Patients are seen on a first-come first-served basis, and a limited number are seen during each clinic.   [facility]  [address] Hillard Danker [place], [place] [phone]   Eligibility Requirements You must have lived in [place], [place], or [place] counties for at least the last three months.   You cannot be eligible for state or federal sponsored [employer], including [employer], [place], or [employer].   You generally cannot be eligible for healthcare insurance through your employer.    How to apply: Eligibility screenings are held every Tuesday and Wednesday afternoon from 1:00 pm until 4:00 pm. You do not need an appointment for the interview!  [facility] [address] [place], [place]   [place]  [place] Department  [place]  [phone]    Behavioral Health Resources in the Community: Intensive Outpatient Programs Organization         Address  Phone  Notes  [place]. [address] [place], [place] [phone]   [facility] Outpatient [address] [place], [place]   ADS: Alcohol & Drug S[facility] [address] [place], [place]   [place] [address]  [place], [place] or [phone]   Substance Abuse Resources Organization         Address  Phone  Notes  Alcohol and Drug Services  [phone]   [place]  [phone]   [facility] [place]   Chinita Pester  [phone]   Residential & Outpatient Substance Abuse  Program  2106011332   Psychological Services Organization         Address  Phone  Notes  [facility] [place]  [place]  [phone]   [place] [address], [place] or [phone]    Mobile Crisis Teams Organization         Address  Phone  Notes  [facility], Mobile Crisis Care Unit  [phone]   [facility]  [address] [place], [place]  [place], [place] [phone]    Self-Help/Support Groups Organization         Address  Phone             Notes  [facility] of [place] - variety of support groups  [place] Call for more information  Narcotics Anonymous (NA), [facility] [address], [employer] [place]  2 meetings at this location   Special educational needs teacher         Address  Phone  Notes  [facility] Residential Treatment [place],    [place]  [phone]   [facility]  [address], [place] [phone], [place], [place]   [place], [place] [phone] Admissions: 8am-3pm M-F  Incentives Substance [place] [address]    [place], [place] [phone]   [facility] [address] [place], [place], [place]   [facility] [address]  [place], [place]   [facility] Programs - Intensive Outpatient [address]een [address], [place], [place]   [facility] ([place] [place].,  [place], [place] [phone] or [phone]   [facility] Services (RTS) [address], [place], [place] Accepts Medicaid  [facility] [place] [address]  [place] [phone] Substance Abuse/Addiction Treatment   [facility] Organization          Address  Phone  Notes  [facility]  [phone]   Domenic Schwab, PhD [address]lis [place], [place]   [phone] or [phone]   [place], [place] [phone]   [facility] [address] [place], [place] [phone] Insurance/Medicaid/sponsorship through [facility] and Families [address]                                    [place], [place] [phone] [place] [address], [place] [phone]    Dr. Adele Schilder  [phone]   [facility] Dept. 1) [address], [place] 2) [place] 3)  [place], [place] [phone]  [phone]   [place] [phone] or [phone] (After Hours)

## 2015-07-16 ENCOUNTER — Encounter (HOSPITAL_BASED_OUTPATIENT_CLINIC_OR_DEPARTMENT_OTHER): Payer: Self-pay | Admitting: *Deleted

## 2015-07-16 ENCOUNTER — Emergency Department (HOSPITAL_BASED_OUTPATIENT_CLINIC_OR_DEPARTMENT_OTHER)
Admission: EM | Admit: 2015-07-16 | Discharge: 2015-07-16 | Disposition: A | Discharge status: Home / Self Care | Payer: Federal, State, Local not specified - PPO | Attending: Emergency Medicine | Admitting: Emergency Medicine

## 2015-07-16 ENCOUNTER — Emergency Department (HOSPITAL_BASED_OUTPATIENT_CLINIC_OR_DEPARTMENT_OTHER): Payer: Federal, State, Local not specified - PPO

## 2015-07-16 DIAGNOSIS — S300XXA Contusion of lower back and pelvis, initial encounter: Secondary | ICD-10-CM | POA: Diagnosis not present

## 2015-07-16 DIAGNOSIS — F17210 Nicotine dependence, cigarettes, uncomplicated: Secondary | ICD-10-CM | POA: Insufficient documentation

## 2015-07-16 DIAGNOSIS — Y9389 Activity, other specified: Secondary | ICD-10-CM | POA: Diagnosis not present

## 2015-07-16 DIAGNOSIS — Z880 Allergy status to penicillin: Secondary | ICD-10-CM | POA: Diagnosis not present

## 2015-07-16 DIAGNOSIS — S29002A Unspecified injury of muscle and tendon of back wall of thorax, initial encounter: Secondary | ICD-10-CM | POA: Diagnosis not present

## 2015-07-16 DIAGNOSIS — G8929 Other chronic pain: Secondary | ICD-10-CM | POA: Diagnosis not present

## 2015-07-16 DIAGNOSIS — E119 Type 2 diabetes mellitus without complications: Secondary | ICD-10-CM | POA: Diagnosis not present

## 2015-07-16 DIAGNOSIS — Z951 Presence of aortocoronary bypass graft: Secondary | ICD-10-CM | POA: Insufficient documentation

## 2015-07-16 DIAGNOSIS — Y9259 Other trade areas as the place of occurrence of the external cause: Secondary | ICD-10-CM | POA: Diagnosis not present

## 2015-07-16 DIAGNOSIS — W010XXA Fall on same level from slipping, tripping and stumbling without subsequent striking against object, initial encounter: Secondary | ICD-10-CM | POA: Diagnosis not present

## 2015-07-16 DIAGNOSIS — Y998 Other external cause status: Secondary | ICD-10-CM | POA: Diagnosis not present

## 2015-07-16 DIAGNOSIS — I10 Essential (primary) hypertension: Secondary | ICD-10-CM | POA: Insufficient documentation

## 2015-07-16 DIAGNOSIS — M545 Low back pain, unspecified: Secondary | ICD-10-CM

## 2015-07-16 DIAGNOSIS — Z8739 Personal history of other diseases of the musculoskeletal system and connective tissue: Secondary | ICD-10-CM | POA: Insufficient documentation

## 2015-07-16 DIAGNOSIS — W19XXXA Unspecified fall, initial encounter: Secondary | ICD-10-CM

## 2015-07-16 DIAGNOSIS — S3992XA Unspecified injury of lower back, initial encounter: Secondary | ICD-10-CM | POA: Diagnosis present

## 2015-07-16 DIAGNOSIS — Z79899 Other long term (current) drug therapy: Secondary | ICD-10-CM | POA: Insufficient documentation

## 2015-07-16 DIAGNOSIS — Z7984 Long term (current) use of oral hypoglycemic drugs: Secondary | ICD-10-CM | POA: Insufficient documentation

## 2015-07-16 MED ORDER — OXYCODONE-ACETAMINOPHEN 5-325 MG PO TABS
1.0000 | ORAL_TABLET | Freq: Once | ORAL | Status: AC
  Administered 2015-07-16: 1 via ORAL
  Filled 2015-07-16: qty 1

## 2015-07-16 NOTE — ED Provider Notes (Signed)
CSN: [id number]     Arrival date & time 07/16/15  1314 History   First MD Initiated Contact with Patient 07/16/15 1403     Chief Complaint  Patient presents with  . Fall   (Consider location/radiation/quality/duration/timing/severity/associated sxs/prior Treatment) Patient is a 60 y.o. female presenting with fall. The history is provided by the patient. No language interpreter was used.  Fall Pertinent negatives include no chills or fever.    Mrs. Ebony Hail is a 60 year old female with a history of hypertension, diabetes, CABG, chronic lower back pain, arthritis, and pain management who presents today complaining of gradual onset worsening back pain after trip and fall on a rug while at [place]. She reports falling into grocery baskets. She was able to stand and ambulate afterwards. She denies any head injury or loss of consciousness. She reports chronic back pain and is on pain contract.  She denies any weakness or numbness in her lower extremities, previous surgeries on her thoracic or lumbar spines.  Past Medical History  Diagnosis Date  . Hypertension   . Diabetes mellitus   . Hx of heart bypass surgery   . Chronic lower back pain   . Arthritis   . Pain management    Past Surgical History  Procedure Laterality Date  . Cardiac surgery    . Coronary artery bypass graft  4 vessel   No family history on file. Social History  Substance Use Topics  . Smoking status: Current Every Day Smoker -- 0.50 packs/day    Types: Cigarettes  . Smokeless tobacco: Never Used  . Alcohol Use: Yes     Comment: wine/ 3x week   OB History    No data available     Review of Systems  Constitutional: Negative for fever and chills.  Musculoskeletal: Positive for back pain.  All other systems reviewed and are negative.     Allergies  Gabapentin and Penicillins  Home Medications   Prior to Admission medications   Medication Sig Start Date End Date Taking? Authorizing Provider   GABAPENTIN, ONCE-DAILY, PO Take by mouth.   Yes Historical Provider, MD  SITagliptin-MetFORMIN HCl (JANUMET PO) Take by mouth.   Yes Historical Provider, MD  losartan (COZAAR) 50 MG tablet Take 50 mg by mouth daily.    Historical Provider, MD  metFORMIN (GLUCOPHAGE) 1000 MG tablet Take 1,000 mg by mouth 2 (two) times daily with a meal.    Historical Provider, MD  nebivolol (BYSTOLIC) 2.5 MG tablet Take 2.5 mg by mouth daily.    Historical Provider, MD  pioglitazone (ACTOS) 45 MG tablet Take 45 mg by mouth daily.      Historical Provider, MD  UNKNOWN TO PATIENT High cholesterol med    Historical Provider, MD   BP 139/80 mmHg  Pulse 82  Temp(Src) 98.6 F (37 C) (Oral)  Resp 20  Ht 5\' 5"  (1.651 m)  Wt 87.998 kg  BMI 32.28 kg/m2  SpO2 100% Physical Exam  Constitutional: She is oriented to person, place, and time. She appears well-developed and well-nourished.  HENT:  Head: Normocephalic and atraumatic.  Eyes: Conjunctivae are normal.  Neck: Normal range of motion. Neck supple.  Cardiovascular: Normal rate and regular rhythm.   Pulmonary/Chest: Effort normal. No respiratory distress.  Abdominal: Soft. There is no tenderness.  Musculoskeletal: Normal range of motion.  Midline lumbar and thoracic tenderness with paravertebral tenderness. Mild ecchymosis along the lumbar spine. Able to move extremities appropriately with dorsi and plantar flexion. Ambulatory. Normal sensory. No saddle anesthesia.  Neurological: She is alert and oriented to person, place, and time.  Skin: Skin is warm and dry.  Nursing note and vitals reviewed.   ED Course  Procedures (including critical care time) Labs Review Labs Reviewed - No data to display  Imaging Review Dg Thoracic Spine 2 View  07/16/2015  CLINICAL DATA:  Tripped over wind-blown rug at [place] in shopping carts, low back pain radiating up to mid back, hypertension, diabetes mellitus, smoker EXAM: THORACIC SPINE 2 VIEWS COMPARISON:   Chest radiographs 01/02/2015 FINDINGS: Twelve pairs of ribs. Diffuse osseous demineralization. Minimal biconvex scoliosis. Vertebral body heights maintained without fracture or subluxation. Mild degenerative disc disease changes lower cervical spine. Visualized ribs intact. Post CABG. IMPRESSION: Osseous demineralization with minimal biconvex thoracic scoliosis. No acute abnormalities. Electronically Signed   By: Lavonia Dana M.D.   On: 07/16/2015 15:05   Dg Lumbar Spine Complete  07/16/2015  CLINICAL DATA:  61 year old female with acute lumbar spine pain following fall. EXAM: LUMBAR SPINE - COMPLETE 4+ VIEW COMPARISON:  05/17/2015 MRI and 04/10/2012 radiographs FINDINGS: Five non rib-bearing lumbar type vertebra are again identified. 8 mm anterolisthesis of L5 on S1 with endplate sclerotic changes again noted. Moderate facet arthropathy with L4-5 and L5-S1 noted. There is no evidence of acute fracture or new subluxation. No suspicious focal bony lesions are identified. IMPRESSION: No evidence of acute abnormality. 8 mm anterolisthesis of L5 on S1 with degenerative endplates changes, and moderate facet arthropathy within the lower lumbar spine again identified. Electronically Signed   By: Margarette Canada M.D.   On: 07/16/2015 15:08   I have personally reviewed and evaluated these image results as part of my medical decision-making.   EKG Interpretation None      MDM   Final diagnoses:  Midline low back pain without sciatica  Fall, initial encounter   Patient presents for lumbar and thoracic back pain after trip and fall while at [facility] today. She is on a pain contract for chronic back pain. On exam, she has midline and paravertebral tenderness to palpation with mild ecchymosis along the lumbar spine. X-rays of the thoracic and lumbar spine were obtainedAnd do not show any acute abnormality. She has degenerative changes and seen on previous imaging.  According to the patient's records she was  prescribed 120 Percocet on 06/25/2015 by her physician for chronic back pain.  Last MRI of L-spine was 2 months ago. I discussed that I would give her Percocet while in the ED but would not prescribe her medications to go home with. I discussed return precautions with the patient as well as follow-up with her primary care physician. Patient agrees with plan.   Ottie Glazier, PA-C 07/16/15 [place], MD 07/19/15 1233

## 2015-07-16 NOTE — Discharge Instructions (Signed)
Back Pain, Adult Follow-up with your primary care physician. Back pain is very common in adults.The cause of back pain is rarely dangerous and the pain often gets better over time.The cause of your back pain may not be known. Some common causes of back pain include:  Strain of the muscles or ligaments supporting the spine.  Wear and tear (degeneration) of the spinal disks.  Arthritis.  Direct injury to the back. For many people, back pain may return. Since back pain is rarely dangerous, most people can learn to manage this condition on their own. HOME CARE INSTRUCTIONS Watch your back pain for any changes. The following actions may help to lessen any discomfort you are feeling:  Remain active. It is stressful on your back to sit or stand in one place for long periods of time. Do not sit, drive, or stand in one place for more than 30 minutes at a time. Take short walks on even surfaces as soon as you are able.Try to increase the length of time you walk each day.  Exercise regularly as directed by your health care provider. Exercise helps your back heal faster. It also helps avoid future injury by keeping your muscles strong and flexible.  Do not stay in bed.Resting more than 1-2 days can delay your recovery.  Pay attention to your body when you bend and lift. The most comfortable positions are those that put less stress on your recovering back. Always use proper lifting techniques, including:  Bending your knees.  Keeping the load close to your body.  Avoiding twisting.  Find a comfortable position to sleep. Use a firm mattress and lie on your side with your knees slightly bent. If you lie on your back, put a pillow under your knees.  Avoid feeling anxious or stressed.Stress increases muscle tension and can worsen back pain.It is important to recognize when you are anxious or stressed and learn ways to manage it, such as with exercise.  Take medicines only as directed by your  health care provider. Over-the-counter medicines to reduce pain and inflammation are often the most helpful.Your health care provider may prescribe muscle relaxant drugs.These medicines help dull your pain so you can more quickly return to your normal activities and healthy exercise.  Apply ice to the injured area:  Put ice in a plastic bag.  Place a towel between your skin and the bag.  Leave the ice on for 20 minutes, 2-3 times a day for the first 2-3 days. After that, ice and heat may be alternated to reduce pain and spasms.  Maintain a healthy weight. Excess weight puts extra stress on your back and makes it difficult to maintain good posture. SEEK MEDICAL CARE IF:  You have pain that is not relieved with rest or medicine.  You have increasing pain going down into the legs or buttocks.  You have pain that does not improve in one week.  You have night pain.  You lose weight.  You have a fever or chills. SEEK IMMEDIATE MEDICAL CARE IF:   You develop new bowel or bladder control problems.  You have unusual weakness or numbness in your arms or legs.  You develop nausea or vomiting.  You develop abdominal pain.  You feel faint.   This information is not intended to replace advice given to you by your health care provider. Make sure you discuss any questions you have with your health care provider.   Document Released: 04/24/2005 Document Revised: 05/15/2014 Document Reviewed: 08/26/2013  Elsevier Interactive Patient Education ©2016 [employer] ° °

## 2015-07-16 NOTE — ED Notes (Signed)
Tripped over a rug and fell into a grocery basket at [employer]. Pain in her back.

## 2016-07-05 ENCOUNTER — Encounter (HOSPITAL_BASED_OUTPATIENT_CLINIC_OR_DEPARTMENT_OTHER): Payer: Self-pay | Admitting: Emergency Medicine

## 2016-07-05 ENCOUNTER — Emergency Department (HOSPITAL_BASED_OUTPATIENT_CLINIC_OR_DEPARTMENT_OTHER)
Admission: EM | Admit: 2016-07-05 | Discharge: 2016-07-05 | Disposition: A | Discharge status: Home / Self Care | Payer: Federal, State, Local not specified - PPO | Attending: Physician Assistant | Admitting: Physician Assistant

## 2016-07-05 ENCOUNTER — Emergency Department (HOSPITAL_BASED_OUTPATIENT_CLINIC_OR_DEPARTMENT_OTHER): Payer: Federal, State, Local not specified - PPO

## 2016-07-05 DIAGNOSIS — E119 Type 2 diabetes mellitus without complications: Secondary | ICD-10-CM | POA: Insufficient documentation

## 2016-07-05 DIAGNOSIS — Z79899 Other long term (current) drug therapy: Secondary | ICD-10-CM | POA: Insufficient documentation

## 2016-07-05 DIAGNOSIS — I10 Essential (primary) hypertension: Secondary | ICD-10-CM | POA: Diagnosis not present

## 2016-07-05 DIAGNOSIS — Z951 Presence of aortocoronary bypass graft: Secondary | ICD-10-CM | POA: Diagnosis not present

## 2016-07-05 DIAGNOSIS — J069 Acute upper respiratory infection, unspecified: Secondary | ICD-10-CM | POA: Diagnosis not present

## 2016-07-05 DIAGNOSIS — Z7984 Long term (current) use of oral hypoglycemic drugs: Secondary | ICD-10-CM | POA: Insufficient documentation

## 2016-07-05 DIAGNOSIS — F17210 Nicotine dependence, cigarettes, uncomplicated: Secondary | ICD-10-CM | POA: Insufficient documentation

## 2016-07-05 DIAGNOSIS — R05 Cough: Secondary | ICD-10-CM | POA: Diagnosis present

## 2016-07-05 LAB — RAPID STREP SCREEN (MED CTR MEBANE ONLY): STREPTOCOCCUS, GROUP A SCREEN (DIRECT): NEGATIVE

## 2016-07-05 MED ORDER — IBUPROFEN 800 MG PO TABS
800.0000 mg | ORAL_TABLET | Freq: Once | ORAL | Status: AC
  Administered 2016-07-05: 800 mg via ORAL
  Filled 2016-07-05: qty 1

## 2016-07-05 MED ORDER — GUAIFENESIN 100 MG/5ML PO LIQD: 100.0000 mg | ORAL | 0 refills | Status: DC | PRN

## 2016-07-05 MED ORDER — GUAIFENESIN 100 MG/5ML PO SOLN
5.0000 mL | Freq: Once | ORAL | Status: AC
  Administered 2016-07-05: 100 mg via ORAL
  Filled 2016-07-05: qty 10

## 2016-07-05 MED ORDER — BENZONATATE 100 MG PO CAPS: 100.0000 mg | ORAL_CAPSULE | Freq: Three times a day (TID) | ORAL | 0 refills | Status: DC | PRN

## 2016-07-05 MED ORDER — IBUPROFEN 800 MG PO TABS: 800.0000 mg | ORAL_TABLET | Freq: Three times a day (TID) | ORAL | 0 refills | Status: DC

## 2016-07-05 MED FILL — BENZONATATE 100 MG CAP: 100 | 7 days supply | Qty: 20 | Fill #0

## 2016-07-05 MED FILL — IBUPROFEN 800 MG TABLET: 800 | 7 days supply | Qty: 21 | Fill #0

## 2016-07-05 MED FILL — ROBAFEN 100 MG/5 ML SYRUP: 100 | 2 days supply | Qty: 118 | Fill #0

## 2016-07-05 NOTE — ED Triage Notes (Signed)
Patient c/o sore throat, R ear pain/facial pain, congestion with green mucous production. Patient sure if she has had a fever. Patient using ibuprofen and cold therapy at home. Patient attempted to see PCP and can't get in until next week.

## 2016-07-05 NOTE — Discharge Instructions (Signed)
Please return with high fevers, difficulty keeping hydrated or other concerns.

## 2016-07-05 NOTE — ED Provider Notes (Signed)
[facility] DEPT [facility] Provider Note   CSN: [id number] Arrival date & time: 07/05/16  1510     History   Chief Complaint Chief Complaint  Patient presents with  . URI    sore throat, r ear pain, cough    HPI Gail Humphrey is a 61 y.o. female.  HPI   Patient is a 61 year-old female presenting with URI symptoms. Patient has sinus congestion, sore throat, lymphadenopathy, cough, congestion.   No fever. No body aches. No Nausea vomiting or diarrhea.  Past Medical History:  Diagnosis Date  . Arthritis   . Chronic lower back pain   . Diabetes mellitus   . Hx of heart bypass surgery   . Hypertension   . Pain management     There are no active problems to display for this patient.   Past Surgical History:  Procedure Laterality Date  . ABDOMINAL HYSTERECTOMY    . CARDIAC SURGERY    . CORONARY ARTERY BYPASS GRAFT  4 vessel    OB History    No data available       Home Medications    Prior to Admission medications   Medication Sig Start Date End Date Taking? Authorizing Provider  Olmesartan-Amlodipine-HCTZ (TRIBENZOR PO) Take by mouth.   Yes Historical Provider, MD  Omega-3 Fatty Acids (FISH OIL PO) Take by mouth.   Yes Historical Provider, MD  SITagliptin-MetFORMIN HCl (JANUMET PO) Take by mouth.   Yes Historical Provider, MD    Family History History reviewed. No pertinent family history.  Social History Social History  Substance Use Topics  . Smoking status: Current Every Day Smoker    Packs/day: 0.50    Types: Cigarettes  . Smokeless tobacco: Never Used  . Alcohol use Yes     Comment: wine/ 3x week     Allergies   Gabapentin and Penicillins   Review of Systems Review of Systems  Constitutional: Negative for activity change.  HENT: Positive for congestion, ear pain, sinus pain, sinus pressure and sore throat. Negative for dental problem and mouth sores.   Respiratory: Positive for cough. Negative for shortness of breath.   Cardiovascular:  Negative for chest pain.  Gastrointestinal: Negative for abdominal pain.  All other systems reviewed and are negative.    Physical Exam Updated Vital Signs BP 153/80 (BP Location: Left Arm)   Pulse 74   Temp 98.6 F (37 C) (Oral)   Resp 18   Ht 5\' 5"  (1.651 m)   Wt 179 lb (81.2 kg)   SpO2 100%   BMI 29.79 kg/m   Physical Exam  Constitutional: She is oriented to person, place, and time. She appears well-developed and well-nourished.  HENT:  Head: Normocephalic and atraumatic.  Eyes: Right eye exhibits no discharge.  Erythematous nasal turbinates. Bilateral dependent membranes normal. Mild pain over right sinus frontal.  Cardiovascular: Normal rate, regular rhythm and normal heart sounds.   No murmur heard. Pulmonary/Chest: Effort normal and breath sounds normal. She has no wheezes. She has no rales.  Abdominal: Soft. She exhibits no distension. There is no tenderness.  Neurological: She is oriented to person, place, and time.  Skin: Skin is warm and dry. She is not diaphoretic.  Psychiatric: She has a normal mood and affect.  Nursing note and vitals reviewed.    ED Treatments / Results  Labs (all labs ordered are listed, but only abnormal results are displayed) Labs Reviewed  RAPID STREP SCREEN (NOT AT [facility])    EKG  EKG Interpretation  None       Radiology No results found.  Procedures Procedures (including critical care time)  Medications Ordered in ED Medications  ibuprofen (ADVIL,MOTRIN) tablet 800 mg (not administered)  guaiFENesin (ROBITUSSIN) 100 MG/5ML solution 100 mg (not administered)     Initial Impression / Assessment and Plan / ED Course  I have reviewed the triage vital signs and the nursing notes.  Pertinent labs & imaging results that were available during my care of the patient were reviewed by me and considered in my medical decision making (see chart for details).     Patient is a 61 year old female here with upper respiratory  symptoms. Patient is afebrile, is eating drinking normally. Will get chest x-ray and rapid strep. Will likely discharge home with symptomatic care. Normal PE and vital signs.   Final Clinical Impressions(s) / ED Diagnoses   Final diagnoses:  None    New Prescriptions New Prescriptions   No medications on file     Kimla Furth Julio Alm, MD 07/05/16 [phone]

## 2016-07-08 LAB — CULTURE, GROUP A STREP (THRC)

## 2016-09-27 LAB — GLUCOSE, POCT (MANUAL RESULT ENTRY): POC GLUCOSE: 142 mg/dL — AB (ref 70–99)

## 2017-01-22 ENCOUNTER — Encounter (HOSPITAL_BASED_OUTPATIENT_CLINIC_OR_DEPARTMENT_OTHER): Payer: Federal, State, Local not specified - PPO

## 2017-02-07 ENCOUNTER — Emergency Department (HOSPITAL_BASED_OUTPATIENT_CLINIC_OR_DEPARTMENT_OTHER): Payer: Federal, State, Local not specified - PPO

## 2017-02-07 ENCOUNTER — Emergency Department (HOSPITAL_BASED_OUTPATIENT_CLINIC_OR_DEPARTMENT_OTHER)
Admission: EM | Admit: 2017-02-07 | Discharge: 2017-02-07 | Disposition: A | Discharge status: Home / Self Care | Payer: Federal, State, Local not specified - PPO | Attending: Emergency Medicine | Admitting: Emergency Medicine

## 2017-02-07 ENCOUNTER — Encounter (HOSPITAL_BASED_OUTPATIENT_CLINIC_OR_DEPARTMENT_OTHER): Payer: Self-pay

## 2017-02-07 DIAGNOSIS — G8929 Other chronic pain: Secondary | ICD-10-CM

## 2017-02-07 DIAGNOSIS — Z79899 Other long term (current) drug therapy: Secondary | ICD-10-CM | POA: Insufficient documentation

## 2017-02-07 DIAGNOSIS — F17210 Nicotine dependence, cigarettes, uncomplicated: Secondary | ICD-10-CM | POA: Insufficient documentation

## 2017-02-07 DIAGNOSIS — M5430 Sciatica, unspecified side: Secondary | ICD-10-CM | POA: Diagnosis present

## 2017-02-07 DIAGNOSIS — M5441 Lumbago with sciatica, right side: Secondary | ICD-10-CM | POA: Diagnosis not present

## 2017-02-07 DIAGNOSIS — I10 Essential (primary) hypertension: Secondary | ICD-10-CM | POA: Diagnosis not present

## 2017-02-07 HISTORY — DX: Sciatica, unspecified side: M54.30

## 2017-02-07 MED ORDER — MORPHINE SULFATE (PF) 4 MG/ML IV SOLN
4.0000 mg | Freq: Once | INTRAVENOUS | Status: AC
  Administered 2017-02-07: 4 mg via INTRAVENOUS
  Filled 2017-02-07: qty 1

## 2017-02-07 MED ORDER — MORPHINE SULFATE (PF) 4 MG/ML IV SOLN: 4.0000 mg | Freq: Once | INTRAVENOUS | Status: DC

## 2017-02-07 MED ORDER — CYCLOBENZAPRINE HCL 5 MG PO TABS: 5.0000 mg | ORAL_TABLET | Freq: Two times a day (BID) | ORAL | 0 refills | Status: DC | PRN

## 2017-02-07 MED ORDER — OXYCODONE-ACETAMINOPHEN 5-325 MG PO TABS
1.0000 | ORAL_TABLET | Freq: Once | ORAL | Status: AC
  Administered 2017-02-07: 1 via ORAL
  Filled 2017-02-07: qty 1

## 2017-02-07 MED ORDER — METHYLPREDNISOLONE 4 MG PO TBPK: ORAL_TABLET | ORAL | 0 refills | Status: DC

## 2017-02-07 MED FILL — METHYLPREDNISOLONE 4 MG TAB: 4 | 6 days supply | Qty: 21 | Fill #0

## 2017-02-07 MED FILL — CYCLOBENZAPRINE 5 MG TABLET: 5 | 7 days supply | Qty: 14 | Fill #0

## 2017-02-07 NOTE — ED Notes (Signed)
Patient transported to X-ray 

## 2017-02-07 NOTE — ED Provider Notes (Signed)
[facility] DEPT [facility] Provider Note   CSN: [id number] Arrival date & time: 02/07/17  1127     History   Chief Complaint Chief Complaint  Patient presents with  . Sciatica    HPI Gail Humphrey is a 61 y.o. female.  The history is provided by the patient and medical records.  Back Pain   This is a chronic problem. The current episode started more than 1 week ago. The problem occurs constantly. The problem has been gradually worsening. The pain is associated with no known injury. The pain is present in the lumbar spine. The quality of the pain is described as stabbing and shooting. The pain radiates to the right foot, right knee and right thigh. The pain is at a severity of 10/10. The pain is severe. The symptoms are aggravated by bending. Stiffness is present all day. Associated symptoms include leg pain. Pertinent negatives include no chest pain, no fever, no numbness, no headaches, no abdominal pain, no bowel incontinence, no perianal numbness, no bladder incontinence, no dysuria, no pelvic pain, no paresthesias, no paresis, no tingling and no weakness. She has tried nothing for the symptoms. The treatment provided no relief.    Past Medical History:  Diagnosis Date  . Arthritis   . Chronic lower back pain   . Diabetes mellitus   . Hx of heart bypass surgery   . Hypertension   . Pain management   . Sciatica     There are no active problems to display for this patient.   Past Surgical History:  Procedure Laterality Date  . ABDOMINAL HYSTERECTOMY    . CARDIAC SURGERY    . CORONARY ARTERY BYPASS GRAFT  4 vessel    OB History    No data available       Home Medications    Prior to Admission medications   Medication Sig Start Date End Date Taking? Authorizing Provider  ibuprofen (ADVIL,MOTRIN) 800 MG tablet Take 1 tablet (800 mg total) by mouth 3 (three) times daily. 07/05/16   Mackuen, Courteney Lyn, MD  Olmesartan-Amlodipine-HCTZ (TRIBENZOR PO) Take by mouth.     [provider]  Omega-3 Fatty Acids (FISH OIL PO) Take by mouth.    [provider]  SITagliptin-MetFORMIN HCl (JANUMET PO) Take by mouth.    [provider]    Family History No family history on file.  Social History Social History  Substance Use Topics  . Smoking status: Current Every Day Smoker    Packs/day: 0.50    Types: Cigarettes  . Smokeless tobacco: Never Used  . Alcohol use Yes     Comment: weekly     Allergies   Gabapentin and Penicillins   Review of Systems Review of Systems  Constitutional: Negative for chills, diaphoresis, fatigue and fever.  HENT: Negative for congestion.   Eyes: Negative for visual disturbance.  Respiratory: Negative for cough, chest tightness, shortness of breath, wheezing and stridor.   Cardiovascular: Negative for chest pain.  Gastrointestinal: Negative for abdominal pain, bowel incontinence, diarrhea, nausea and vomiting.  Genitourinary: Negative for bladder incontinence, difficulty urinating, dysuria, flank pain, frequency and pelvic pain.  Musculoskeletal: Positive for back pain. Negative for neck pain and neck stiffness.  Skin: Negative for rash and wound.  Neurological: Negative for tingling, weakness, light-headedness, numbness, headaches and paresthesias.  Psychiatric/Behavioral: Negative for agitation.  All other systems reviewed and are negative.    Physical Exam Updated Vital Signs BP 140/85 (BP Location: Right Arm)   Pulse 92  Temp 98.1 F (36.7 C) (Oral)   Resp 18   Ht 5\' 5"  (1.651 m)   Wt 77.1 kg (170 lb)   SpO2 100%   BMI 28.29 kg/m   Physical Exam  Constitutional: She is oriented to person, place, and time. She appears well-developed and well-nourished. No distress.  HENT:  Head: Normocephalic and atraumatic.  Right Ear: External ear normal.  Left Ear: External ear normal.  Nose: Nose normal.  Mouth/Throat: Oropharynx is clear and moist. No oropharyngeal exudate.  Eyes:  Pupils are equal, round, and reactive to light. Conjunctivae and EOM are normal.  Neck: Normal range of motion. Neck supple.  Cardiovascular: Normal rate and intact distal pulses.   No murmur heard. Pulmonary/Chest: Effort normal. No stridor. No respiratory distress. She has no wheezes. She exhibits no tenderness.  Abdominal: She exhibits no distension. There is no tenderness. There is no rebound.  Musculoskeletal: She exhibits tenderness.       Lumbar back: She exhibits tenderness and pain.       Back:  Low back tenserness, R low back tenderness. Straight leg raise test positive with pain radiating to foot on R. No numbness or tingling. Normal pulse.   Neurological: She is alert and oriented to person, place, and time. She has normal reflexes. She is not disoriented. No cranial nerve deficit or sensory deficit. She exhibits normal muscle tone. Coordination and gait normal. GCS eye subscore is 4. GCS verbal subscore is 5. GCS motor subscore is 6.  Skin: Skin is warm. Capillary refill takes less than 2 seconds. No rash noted. She is not diaphoretic. No erythema.  Psychiatric: She has a normal mood and affect.  Nursing note and vitals reviewed.    ED Treatments / Results  Labs (all labs ordered are listed, but only abnormal results are displayed) Labs Reviewed - No data to display  EKG  EKG Interpretation None       Radiology Dg Lumbar Spine Complete  Result Date: 02/07/2017 CLINICAL DATA:  Right sciatic pain for the past 5 days. History of diabetes, chronic low back pain, and sciatica. EXAM: LUMBAR SPINE - COMPLETE 4+ VIEW COMPARISON:  Lumbar spine series of July 16, 2015 FINDINGS: The lumbar vertebral bodies are preserved in height. The pedicles and transverse processes through L4 are normal. The pedicles of L5 are obscured by overlying facet joint hypertrophy. The transverse processes of L5 are grossly normal. There is grade 1 anterolisthesis of L5 with respect to S1. The degree of  slippage is stable at approximately 8.5 mm. There is disc space narrowing at L5-S1 with eburnation of the endplates. The other disc space heights are well maintained. There is facet joint hypertrophy at L4-5 and at L5-S1. The observed portions of the sacrum are normal. There is calcification in the wall of the abdominal aorta. IMPRESSION: Stable grade 1 anterolisthesis of L5 with respect S1. This is likely secondary to the moderate degenerative disc disease that is present here. There is considerable facet joint hypertrophy at L4-5 and at L5-S1. There is no compression fracture. Abdominal aortic atherosclerosis. Electronically Signed   By: David  Martinique M.D.   On: 02/07/2017 14:14    Procedures Procedures (including critical care time)  Medications Ordered in ED Medications  morphine 4 MG/ML injection 4 mg (4 mg Intravenous Given 02/07/17 1330)     Initial Impression / Assessment and Plan / ED Course  I have reviewed the triage vital signs and the nursing notes.  Pertinent labs & imaging results  that were available during my care of the patient were reviewed by me and considered in my medical decision making (see chart for details).     Makenzey Nanni is a 61 y.o. female With a past medical history significant for hypertension, diabetes, CAD with CABG, and chronic low back pain with sciatica managed by a pain clinic who presents with right-sided low back pain and sciatica. Patient says that she has had pain for months but it worsened over the last few days. She denies any falls or injuries. She does report that she did more housework over the weekend and think she may have aggravated it. She reports the pain as sharp and stabbing in her right low back. Radiates down her leg whenever she moves or walks. She denies loss of bowel or bladder function. She denies numbness, tingling, or weakness. She denies any falls. She describes the pain as 10 out of 10 in severity. She also reports a mild diarrhea  but denies any other complaints. No fevers or chills or IV drug use.  On exam, patient is tenderness in the mid-and right low back. Straight leg raise positive on the right. Normal sensation and strengthen the legs. Normal pulses. Physical exam otherwise unremarkable.  Patient had x-ray showing no significant change from prior x-rays. No new evidence of acute fracture fracture or new malalignment. Patient was given dose of I am pain medication with improvement in symptoms. Patient reports that she takes Percocet at home however steroids filter in the past.  Patient will be given prescription for steroids and muscle relaxant. Patient advised to be careful not to fall on the muscle relaxant. Patient will continue her home pain regimen. Patient is currently pursuing spine follow-up to discuss surgical options. No red flags are seen today with the patient, do not feel she needs MRI or other imaging at this time.  As patient felt better, she was able to walk. Patient will follow up with PCP and spine team. Patient understands return precautions and had no other questions or concerns. Patient discharged in good condition with improving symptoms.   Final Clinical Impressions(s) / ED Diagnoses   Final diagnoses:  Chronic right-sided low back pain with right-sided sciatica    New Prescriptions Discharge Medication List as of 02/07/2017  3:30 PM    START taking these medications   Details  cyclobenzaprine (FLEXERIL) 5 MG tablet Take 1 tablet (5 mg total) by mouth 2 (two) times daily as needed for muscle spasms., Starting Wed 02/07/2017, Print    methylPREDNISolone (MEDROL DOSEPAK) 4 MG TBPK tablet Day 1: 8 mg PO before breakfast, 4 mg after lunch and after dinner, and 8 mg at bedtime  Day 2: 4 mg PO before breakfast, after lunch, and after dinner and 8 mg at bedtime  Day 3: 4 mg PO before breakfast, after lunch, after dinner, and at bedtime   Day 4: 4 mg PO before breakfast, after lunch, and at  bedtime  Day 5: 4 mg PO before breakfast and at bedtime  Day 6: 4 mg PO before breakfast, Print       Clinical Impression: 1. Chronic right-sided low back pain with right-sided sciatica     Disposition: Discharge  Condition: Good  I have discussed the results, Dx and Tx plan with the pt(& family if present). He/she/they expressed understanding and agree(s) with the plan. Discharge instructions discussed at great length. Strict return precautions discussed and pt &/or family have verbalized understanding of the instructions. No further questions  at time of discharge.    Discharge Medication List as of 02/07/2017  3:30 PM    START taking these medications   Details  cyclobenzaprine (FLEXERIL) 5 MG tablet Take 1 tablet (5 mg total) by mouth 2 (two) times daily as needed for muscle spasms., Starting Wed 02/07/2017, Print    methylPREDNISolone (MEDROL DOSEPAK) 4 MG TBPK tablet Day 1: 8 mg PO before breakfast, 4 mg after lunch and after dinner, and 8 mg at bedtime  Day 2: 4 mg PO before breakfast, after lunch, and after dinner and 8 mg at bedtime  Day 3: 4 mg PO before breakfast, after lunch, after dinner, and at bedtime   Day 4: 4 mg PO before breakfast, after lunch, and at bedtime  Day 5: 4 mg PO before breakfast and at bedtime  Day 6: 4 mg PO before breakfast, Print        Follow Up: Guadlupe Spanish, MD [place] [address] [phone]  Schedule an appointment as soon as possible for a visit    [facility] [address] [id number] mc [place] [address] [phone]  If symptoms worsen     Janit Cutter, Gwenyth Allegra, MD 02/07/17 2043

## 2017-02-07 NOTE — ED Notes (Signed)
ED Provider at bedside. 

## 2017-02-07 NOTE — ED Notes (Signed)
Patient reports that her bilateral feet itch

## 2017-02-07 NOTE — ED Triage Notes (Signed)
C/o right "sciatic nerve pain" x 4 days-presents to triage in w/c

## 2017-02-07 NOTE — Discharge Instructions (Signed)
Please use the steroids and the muscle relaxant to help with your symptoms. Please follow-up with your primary care physician and discuss spine team follow-up. We did not find evidence of new injury today. If any symptoms change or worsen, please return to the nearest emergency department.

## 2017-04-23 DIAGNOSIS — I2510 Atherosclerotic heart disease of native coronary artery without angina pectoris: Secondary | ICD-10-CM | POA: Insufficient documentation

## 2017-04-23 DIAGNOSIS — Z951 Presence of aortocoronary bypass graft: Secondary | ICD-10-CM | POA: Insufficient documentation

## 2018-02-13 ENCOUNTER — Other Ambulatory Visit: Payer: Self-pay

## 2018-02-13 ENCOUNTER — Emergency Department (HOSPITAL_BASED_OUTPATIENT_CLINIC_OR_DEPARTMENT_OTHER)
Admission: EM | Admit: 2018-02-13 | Discharge: 2018-02-13 | Disposition: A | Discharge status: Home / Self Care | Payer: Federal, State, Local not specified - PPO | Attending: Emergency Medicine | Admitting: Emergency Medicine

## 2018-02-13 ENCOUNTER — Encounter (HOSPITAL_BASED_OUTPATIENT_CLINIC_OR_DEPARTMENT_OTHER): Payer: Self-pay

## 2018-02-13 DIAGNOSIS — Z951 Presence of aortocoronary bypass graft: Secondary | ICD-10-CM | POA: Diagnosis not present

## 2018-02-13 DIAGNOSIS — E119 Type 2 diabetes mellitus without complications: Secondary | ICD-10-CM | POA: Diagnosis not present

## 2018-02-13 DIAGNOSIS — G8929 Other chronic pain: Secondary | ICD-10-CM | POA: Diagnosis not present

## 2018-02-13 DIAGNOSIS — M545 Low back pain: Secondary | ICD-10-CM | POA: Diagnosis present

## 2018-02-13 DIAGNOSIS — M5416 Radiculopathy, lumbar region: Secondary | ICD-10-CM | POA: Diagnosis not present

## 2018-02-13 DIAGNOSIS — Z79899 Other long term (current) drug therapy: Secondary | ICD-10-CM | POA: Diagnosis not present

## 2018-02-13 DIAGNOSIS — F17210 Nicotine dependence, cigarettes, uncomplicated: Secondary | ICD-10-CM | POA: Diagnosis not present

## 2018-02-13 MED ORDER — METHOCARBAMOL 500 MG PO TABS: 1000.0000 mg | ORAL_TABLET | Freq: Four times a day (QID) | ORAL | 0 refills | Status: DC

## 2018-02-13 MED ORDER — HYDROMORPHONE HCL 1 MG/ML IJ SOLN
1.0000 mg | Freq: Once | INTRAMUSCULAR | Status: AC
  Administered 2018-02-13: 1 mg via INTRAVENOUS
  Filled 2018-02-13: qty 1

## 2018-02-13 MED ORDER — SODIUM CHLORIDE 0.9 % IV BOLUS
500.0000 mL | Freq: Once | INTRAVENOUS | Status: AC
  Administered 2018-02-13: 500 mL via INTRAVENOUS

## 2018-02-13 MED ORDER — KETOROLAC TROMETHAMINE 15 MG/ML IJ SOLN
15.0000 mg | Freq: Once | INTRAMUSCULAR | Status: AC
  Administered 2018-02-13: 15 mg via INTRAVENOUS
  Filled 2018-02-13: qty 1

## 2018-02-13 MED ORDER — ONDANSETRON HCL 4 MG/2ML IJ SOLN
4.0000 mg | Freq: Once | INTRAMUSCULAR | Status: AC
  Administered 2018-02-13: 4 mg via INTRAVENOUS
  Filled 2018-02-13: qty 2

## 2018-02-13 MED ORDER — PREDNISONE 20 MG PO TABS: ORAL_TABLET | ORAL | 0 refills | Status: DC

## 2018-02-13 MED ORDER — METHOCARBAMOL 500 MG PO TABS
1000.0000 mg | ORAL_TABLET | Freq: Once | ORAL | Status: AC
  Administered 2018-02-13: 1000 mg via ORAL
  Filled 2018-02-13: qty 2

## 2018-02-13 MED ORDER — DEXAMETHASONE SODIUM PHOSPHATE 10 MG/ML IJ SOLN
10.0000 mg | Freq: Once | INTRAMUSCULAR | Status: AC
  Administered 2018-02-13: 10 mg via INTRAVENOUS
  Filled 2018-02-13: qty 1

## 2018-02-13 NOTE — ED Provider Notes (Addendum)
[facility] EMERGENCY DEPARTMENT Provider Note   CSN: [id number] Arrival date & time: 02/13/18  1138     History   Chief Complaint Chief Complaint  Patient presents with  . Back Pain    HPI Gail Humphrey is a 62 y.o. female.  Patient with history of chronic back pain, under care of pain management at [facility], takes Percocet 10-325 mg 5 times a day, receives steroid injections in her back every several months, last injection in August --presents the emergency department with exacerbation of her typical right-sided lumbar radicular pain.  Patient states that her symptoms started to be uncontrolled approximately 3 weeks ago after receiving a massage.  She describes pain that shoots down her buttocks into her leg and down towards her foot.  She has unsteadiness due to pain.  She has not noticed increased weakness.  Typically ambulates with a cane. Patient denies warning symptoms of back pain including: fecal incontinence, urinary retention or overflow incontinence, night sweats, waking from sleep with back pain, unexplained fevers or weight loss, h/o cancer, IVDU, recent trauma.  She has been taking her home pain medications however this has not been as effective.      Past Medical History:  Diagnosis Date  . Arthritis   . Chronic lower back pain   . Diabetes mellitus   . Hx of heart bypass surgery   . Hypertension   . Pain management   . Sciatica     There are no active problems to display for this patient.   Past Surgical History:  Procedure Laterality Date  . ABDOMINAL HYSTERECTOMY    . CARDIAC SURGERY    . CORONARY ARTERY BYPASS GRAFT  4 vessel     OB History   None      Home Medications    Prior to Admission medications   Medication Sig Start Date End Date Taking? Authorizing Provider  cyclobenzaprine (FLEXERIL) 5 MG tablet Take 1 tablet (5 mg total) by mouth 2 (two) times daily as needed for muscle spasms. 02/07/17   Tegeler, Gwenyth Allegra, MD    ibuprofen (ADVIL,MOTRIN) 800 MG tablet Take 1 tablet (800 mg total) by mouth 3 (three) times daily. 07/05/16   Mackuen, Courteney Lyn, MD  methylPREDNISolone (MEDROL DOSEPAK) 4 MG TBPK tablet Day 1: 8 mg PO before breakfast, 4 mg after lunch and after dinner, and 8 mg at bedtime  Day 2: 4 mg PO before breakfast, after lunch, and after dinner and 8 mg at bedtime  Day 3: 4 mg PO before breakfast, after lunch, after dinner, and at bedtime  Day 4: 4 mg PO before breakfast, after lunch, and at bedtime  Day 5: 4 mg PO before breakfast and at bedtime  Day 6: 4 mg PO before breakfast 02/07/17   Tegeler, Gwenyth Allegra, MD  Olmesartan-Amlodipine-HCTZ (TRIBENZOR PO) Take by mouth.    [provider]  Omega-3 Fatty Acids (FISH OIL PO) Take by mouth.    [provider]  SITagliptin-MetFORMIN HCl (JANUMET PO) Take by mouth.    [provider]    Family History No family history on file.  Social History Social History   Tobacco Use  . Smoking status: Current Every Day Smoker    Packs/day: 0.50    Types: Cigarettes  . Smokeless tobacco: Never Used  Substance Use Topics  . Alcohol use: Yes    Comment: weekly  . Drug use: No     Allergies   Gabapentin and Penicillins  Review of Systems Review of Systems  Constitutional: Negative for fever and unexpected weight change.  Gastrointestinal: Negative for constipation.       Negative for fecal incontinence.   Genitourinary: Negative for dysuria, flank pain, hematuria and pelvic pain.       Negative for urinary incontinence or retention.  Musculoskeletal: Positive for back pain.  Neurological: Negative for weakness and numbness.       Denies saddle paresthesias.     Physical Exam Updated Vital Signs BP (!) 152/78 (BP Location: Right Arm)   Pulse (!) 122   Temp 98.4 F (36.9 C) (Oral)   Resp (!) 24   Ht 5\' 5"  (1.651 m)   Wt 70.3 kg   SpO2 98%   BMI 25.79 kg/m   Physical Exam  Constitutional: She  appears well-developed and well-nourished.  HENT:  Head: Normocephalic and atraumatic.  Eyes: Conjunctivae are normal.  Neck: Normal range of motion. Neck supple.  Cardiovascular: Regular rhythm.  Tachycardia.  Pulmonary/Chest: Effort normal.  Abdominal: Soft. There is no tenderness. There is no CVA tenderness.  Musculoskeletal: Normal range of motion.       Right hip: Normal.       Left hip: Normal.       Cervical back: She exhibits normal range of motion, no tenderness and no bony tenderness.       Thoracic back: She exhibits tenderness (lower t-spine, right-sided paraspinous).       Lumbar back: She exhibits tenderness (Right-sided paraspinous). She exhibits normal range of motion and no bony tenderness.  No step-off noted with palpation of spine.   Neurological: She is alert. She has normal strength and normal reflexes. No sensory deficit.  5/5 strength in entire lower extremities bilaterally. No sensation deficit.   Skin: Skin is warm and dry. No rash noted.  Psychiatric: She has a normal mood and affect.  Nursing note and vitals reviewed.    ED Treatments / Results  Labs (all labs ordered are listed, but only abnormal results are displayed) Labs Reviewed - No data to display  EKG None  Radiology No results found.  Procedures Procedures (including critical care time)  Medications Ordered in ED Medications  HYDROmorphone (DILAUDID) injection 1 mg (1 mg Intravenous Given 02/13/18 1404)  ondansetron (ZOFRAN) injection 4 mg (4 mg Intravenous Given 02/13/18 1404)  dexamethasone (DECADRON) injection 10 mg (10 mg Intravenous Given 02/13/18 1406)  sodium chloride 0.9 % bolus 500 mL (0 mLs Intravenous Stopped 02/13/18 1504)  HYDROmorphone (DILAUDID) injection 1 mg (1 mg Intravenous Given 02/13/18 1534)  methocarbamol (ROBAXIN) tablet 1,000 mg (1,000 mg Oral Given 02/13/18 1533)  ketorolac (TORADOL) 15 MG/ML injection 15 mg (15 mg Intravenous Given 02/13/18 1633)     Initial  Impression / Assessment and Plan / ED Course  I have reviewed the triage vital signs and the nursing notes.  Pertinent labs & imaging results that were available during my care of the patient were reviewed by me and considered in my medical decision making (see chart for details).     Patient seen and examined.  Reviewed with [place] substance reporting database.  Patient has been receiving her medications from one prescriber.  We will treat her pain with IV narcotics here as well as IV steroids.  Will reassess.  Anticipate discharge to home once symptoms are controlled.  No red flags noted at this time and symptoms seem typical for exacerbation of her typical radicular pain.  Vital signs reviewed and are as follows: BP [place] Kitchen)  152/78 (BP Location: Right Arm)   Pulse (!) 122   Temp 98.4 F (36.9 C) (Oral)   Resp (!) 24   Ht 5\' 5"  (1.651 m)   Wt 70.3 kg   SpO2 98%   BMI 25.79 kg/m   4:55 PM patient has been up ambulating in the emergency department, however with limited improvement to this point.  She was given additional dose of Dilaudid as well as Toradol.  Patient is ready for discharged home at this time.  Heart rate improved.  She will be discharged home with 12-day prednisone taper as well as Robaxin.  She will continue use of her home chronic Percocet.  She has an appointment with her pain management doctor in 1 week.  She is encouraged to keep this appointment.  Final Clinical Impressions(s) / ED Diagnoses   Final diagnoses:  Lumbar radiculopathy   Patient with back pain with radicular features. No neurological deficits. Patient is ambulatory. No warning symptoms of back pain including: fecal incontinence, urinary retention or overflow incontinence, night sweats, waking from sleep with back pain, unexplained fevers or weight loss, h/o cancer, IVDU, recent trauma. No concern for cauda equina, epidural abscess, or other serious cause of back pain. Conservative measures such as rest,  ice/heat and pain medicine indicated with PCP follow-up if no improvement with conservative management.    ED Discharge Orders         Ordered    predniSONE (DELTASONE) 20 MG tablet     02/13/18 1653    methocarbamol (ROBAXIN) 500 MG tablet  4 times daily     02/13/18 1654           Carlisle Cater, PA-C 02/13/18 1656    Carlisle Cater, PA-C 02/13/18 1657    Tegeler, Gwenyth Allegra, MD 02/13/18 [phone]

## 2018-02-13 NOTE — Discharge Instructions (Signed)
Please read and follow all provided instructions.  Your diagnoses today include:  1. Lumbar radiculopathy     Tests performed today include:  Vital signs - see below for your results today  Medications prescribed:   Prednisone - steroid medicine   It is best to take this medication in the morning to prevent sleeping problems. If you are diabetic, monitor your blood sugar closely and stop taking Prednisone if blood sugar is over 300. Take with food to prevent stomach upset.    Robaxin (methocarbamol) - muscle relaxer medication  DO NOT drive or perform any activities that require you to be awake and alert because this medicine can make you drowsy.   Take any prescribed medications only as directed.  Home care instructions:   Follow any educational materials contained in this packet  Please rest, use ice or heat on your back for the next several days  Do not lift, push, pull anything more than 10 pounds for the next week  Follow-up instructions: Please follow-up with your primary care provider in the next 1 week for further evaluation of your symptoms.   Return instructions:  SEEK IMMEDIATE MEDICAL ATTENTION IF YOU HAVE:  New numbness, tingling, weakness, or problem with the use of your arms or legs  Severe back pain not relieved with medications  Loss control of your bowels or bladder  Increasing pain in any areas of the body (such as chest or abdominal pain)  Shortness of breath, dizziness, or fainting.   Worsening nausea (feeling sick to your stomach), vomiting, fever, or sweats  Any other emergent concerns regarding your health   Additional Information:  Your vital signs today were: BP 106/68 (BP Location: Left Arm)    Pulse 71    Temp 98.4 F (36.9 C) (Oral)    Resp 18    Ht 5\' 5"  (1.651 m)    Wt 70.3 kg    SpO2 98%    BMI 25.79 kg/m  If your blood pressure (BP) was elevated above 135/85 this visit, please have this repeated by your doctor within one  month. --------------

## 2018-02-13 NOTE — ED Notes (Signed)
ED Provider at bedside. 

## 2018-02-13 NOTE — ED Triage Notes (Addendum)
Pt c/o lower back pain and right side sciatic pain-to triage in w/c-states she has chronic back pain and in pain management at [facility]

## 2018-02-13 NOTE — ED Notes (Signed)
Pt states nothing is really helping and she would like to go home

## 2018-12-25 ENCOUNTER — Emergency Department (HOSPITAL_BASED_OUTPATIENT_CLINIC_OR_DEPARTMENT_OTHER): Payer: Federal, State, Local not specified - PPO

## 2018-12-25 ENCOUNTER — Emergency Department (HOSPITAL_BASED_OUTPATIENT_CLINIC_OR_DEPARTMENT_OTHER)
Admission: EM | Admit: 2018-12-25 | Discharge: 2018-12-25 | Disposition: A | Discharge status: Home / Self Care | Payer: Federal, State, Local not specified - PPO | Attending: Emergency Medicine | Admitting: Emergency Medicine

## 2018-12-25 ENCOUNTER — Encounter (HOSPITAL_BASED_OUTPATIENT_CLINIC_OR_DEPARTMENT_OTHER): Payer: Self-pay | Admitting: *Deleted

## 2018-12-25 ENCOUNTER — Other Ambulatory Visit: Payer: Self-pay

## 2018-12-25 DIAGNOSIS — M545 Low back pain, unspecified: Secondary | ICD-10-CM

## 2018-12-25 DIAGNOSIS — E119 Type 2 diabetes mellitus without complications: Secondary | ICD-10-CM | POA: Diagnosis not present

## 2018-12-25 DIAGNOSIS — I10 Essential (primary) hypertension: Secondary | ICD-10-CM | POA: Insufficient documentation

## 2018-12-25 DIAGNOSIS — F17210 Nicotine dependence, cigarettes, uncomplicated: Secondary | ICD-10-CM | POA: Insufficient documentation

## 2018-12-25 DIAGNOSIS — Z951 Presence of aortocoronary bypass graft: Secondary | ICD-10-CM | POA: Diagnosis not present

## 2018-12-25 MED ORDER — OXYCODONE-ACETAMINOPHEN 5-325 MG PO TABS
2.0000 | ORAL_TABLET | Freq: Once | ORAL | Status: AC
  Administered 2018-12-25: 2 via ORAL
  Filled 2018-12-25: qty 2

## 2018-12-25 MED ORDER — CYCLOBENZAPRINE HCL 10 MG PO TABS: 10.0000 mg | ORAL_TABLET | Freq: Two times a day (BID) | ORAL | 0 refills | Status: DC | PRN

## 2018-12-25 MED FILL — CYCLOBENZAPRINE HCL 10 MG T: 10 | 10 days supply | Qty: 20 | Fill #0

## 2018-12-25 NOTE — ED Provider Notes (Signed)
[facility] Emergency Department Provider Note MRN:  [id number]  Arrival date & time: 12/25/18     Chief Complaint   Fall   History of Present Illness   Gail Humphrey is a 63 y.o. year-old female with a history of chronic back pain, diabetes presenting to the ED with chief complaint of back pain.  Patient slipped on the floor yesterday and fell backwards onto her back.  Mild head trauma, no loss of consciousness, no nausea or vomiting since the fall.  No numbness or weakness in the arms or legs.  Patient is endorsing acute worsening of her chronic low back pain.  Located in the lumbar midline back, moderate to severe.  Patient also explains that she has been without her chronic pain medication for 2 days.  Review of Systems  A complete 10 system review of systems was obtained and all systems are negative except as noted in the HPI and PMH.   Patient's Health History    Past Medical History:  Diagnosis Date  . Arthritis   . Chronic lower back pain   . Diabetes mellitus   . Hx of heart bypass surgery   . Hypertension   . Pain management   . Sciatica     Past Surgical History:  Procedure Laterality Date  . ABDOMINAL HYSTERECTOMY    . CARDIAC SURGERY    . CORONARY ARTERY BYPASS GRAFT  4 vessel    History reviewed. No pertinent family history.  Social History   Socioeconomic History  . Marital status: Single    Spouse name: Not on file  . Number of children: Not on file  . Years of education: Not on file  . Highest education level: Not on file  Occupational History  . Not on file  Social Needs  . Financial resource strain: Not on file  . Food insecurity    Worry: Not on file    Inability: Not on file  . Transportation needs    Medical: Not on file    Non-medical: Not on file  Tobacco Use  . Smoking status: Current Every Day Smoker    Packs/day: 0.50    Types: Cigarettes  . Smokeless tobacco: Never Used  Substance and Sexual  Activity  . Alcohol use: Yes    Comment: weekly  . Drug use: No  . Sexual activity: Not on file  Lifestyle  . Physical activity    Days per week: Not on file    Minutes per session: Not on file  . Stress: Not on file  Relationships  . Social Herbalist on phone: Not on file    Gets together: Not on file    Attends religious service: Not on file    Active member of club or organization: Not on file    Attends meetings of clubs or organizations: Not on file    Relationship status: Not on file  . Intimate partner violence    Fear of current or ex partner: Not on file    Emotionally abused: Not on file    Physically abused: Not on file    Forced sexual activity: Not on file  Other Topics Concern  . Not on file  Social History Narrative  . Not on file     Physical Exam  Vital Signs and Nursing Notes reviewed Vitals:   12/25/18 1148  BP: 139/84  Pulse: 85  Resp: 20  Temp: 98.6 F (37 C)  SpO2: 100%  CONSTITUTIONAL: Well-appearing, NAD NEURO:  Alert and oriented x 3, no focal deficits EYES:  eyes equal and reactive ENT/NECK:  no LAD, no JVD CARDIO: Regular rate, well-perfused, normal S1 and S2 PULM:  CTAB no wheezing or rhonchi GI/GU:  normal bowel sounds, non-distended, non-tender MSK/SPINE:  No gross deformities, no edema; moderate midline lumbar tenderness to palpation SKIN:  no rash, atraumatic PSYCH:  Appropriate speech and behavior  Diagnostic and Interventional Summary    Labs Reviewed - No data to display  DG Lumbar Spine Complete  Final Result      Medications  oxyCODONE-acetaminophen (PERCOCET/ROXICET) 5-325 MG per tablet 2 tablet (2 tablets Oral Given 12/25/18 1202)     Procedures Critical Care  ED Course and Medical Decision Making  I have reviewed the triage vital signs and the nursing notes.  Pertinent labs & imaging results that were available during my care of the patient were reviewed by me and considered in my medical decision  making (see below for details).  X-ray to exclude fracture in this 63 year old female with acute on chronic lumbar back pain.  No neurological deficits, no bowel or bladder dysfunction, nothing to suggest myelopathy.  Patient is especially reasonable with regard to her pain plan.  It seems that she missed a virtual or phone call appointment purely by mistake and because of this she was "fired" from her pain management group.  She has secured a follow-up visit to discuss this issue with her pain clinic in 4 to 5 days.  According to her PMP review, she receives a large quantity of opioids, but only once monthly and very regularly.  I offered her a few days of her home pain medicine to help her get to this appointment, but she declined, stating that she did not want to "get into trouble" with her pain clinic and would prefer to follow rules and follow-up with her pain clinic.  X-ray is unremarkable, nothing acute patient has had issue with tizanidine side effects in the past, will trial Flexeril.  Barth Kirks. Sedonia Small, [place] [email or web address]  Final Clinical Impressions(s) / ED Diagnoses     ICD-10-CM   1. Acute midline low back pain without sciatica  M54.5     ED Discharge Orders         Ordered    cyclobenzaprine (FLEXERIL) 10 MG tablet  2 times daily PRN     12/25/18 1239            Discharge Instructions     You were evaluated in the Emergency Department and after careful evaluation, we did not find any emergent condition requiring admission or further testing in the hospital.  Your x-ray today was unchanged without any new injuries.  As discussed, we will provide you with a Flexeril muscle relaxer and have you follow-up with your pain management clinic.  Please return to the Emergency Department if you experience any worsening of your condition.  We encourage you to follow up with a primary care provider.  Thank you for allowing [place]  to be a part of your care.       Maudie Flakes, MD 12/25/18 1246

## 2018-12-25 NOTE — Discharge Instructions (Addendum)
You were evaluated in the Emergency Department and after careful evaluation, we did not find any emergent condition requiring admission or further testing in the hospital.  Your x-ray today was unchanged without any new injuries.  As discussed, we will provide you with a Flexeril muscle relaxer and have you follow-up with your pain management clinic.  Please return to the Emergency Department if you experience any worsening of your condition.  We encourage you to follow up with a primary care provider.  Thank you for allowing [place] to be a part of your care.

## 2018-12-25 NOTE — ED Notes (Signed)
Patient transported to X-ray 

## 2018-12-25 NOTE — ED Triage Notes (Signed)
Pt reports fall yesterday, states she has been out of her oxycodone since Monday. Pt is a/a amb in nad, reports her pain is 10/10.

## 2020-01-06 ENCOUNTER — Other Ambulatory Visit: Payer: Self-pay | Admitting: Internal Medicine

## 2020-01-06 DIAGNOSIS — Z1231 Encounter for screening mammogram for malignant neoplasm of breast: Secondary | ICD-10-CM

## 2020-01-16 ENCOUNTER — Ambulatory Visit: Payer: Federal, State, Local not specified - PPO

## 2020-02-04 DIAGNOSIS — E785 Hyperlipidemia, unspecified: Secondary | ICD-10-CM | POA: Insufficient documentation

## 2020-02-04 DIAGNOSIS — E1169 Type 2 diabetes mellitus with other specified complication: Secondary | ICD-10-CM | POA: Insufficient documentation

## 2020-02-04 DIAGNOSIS — E559 Vitamin D deficiency, unspecified: Secondary | ICD-10-CM | POA: Insufficient documentation

## 2020-02-04 DIAGNOSIS — N183 Chronic kidney disease, stage 3 unspecified: Secondary | ICD-10-CM | POA: Insufficient documentation

## 2020-07-13 ENCOUNTER — Other Ambulatory Visit: Payer: Self-pay

## 2020-07-13 ENCOUNTER — Encounter (HOSPITAL_BASED_OUTPATIENT_CLINIC_OR_DEPARTMENT_OTHER): Payer: Self-pay

## 2020-07-13 ENCOUNTER — Emergency Department (HOSPITAL_BASED_OUTPATIENT_CLINIC_OR_DEPARTMENT_OTHER)
Admission: EM | Admit: 2020-07-13 | Discharge: 2020-07-13 | Disposition: A | Discharge status: Home / Self Care | Payer: No Typology Code available for payment source | Attending: Emergency Medicine | Admitting: Emergency Medicine

## 2020-07-13 DIAGNOSIS — S161XXA Strain of muscle, fascia and tendon at neck level, initial encounter: Secondary | ICD-10-CM | POA: Diagnosis not present

## 2020-07-13 DIAGNOSIS — Z7984 Long term (current) use of oral hypoglycemic drugs: Secondary | ICD-10-CM | POA: Insufficient documentation

## 2020-07-13 DIAGNOSIS — I10 Essential (primary) hypertension: Secondary | ICD-10-CM | POA: Insufficient documentation

## 2020-07-13 DIAGNOSIS — Z79899 Other long term (current) drug therapy: Secondary | ICD-10-CM | POA: Insufficient documentation

## 2020-07-13 DIAGNOSIS — Z951 Presence of aortocoronary bypass graft: Secondary | ICD-10-CM | POA: Insufficient documentation

## 2020-07-13 DIAGNOSIS — Y92410 Unspecified street and highway as the place of occurrence of the external cause: Secondary | ICD-10-CM | POA: Diagnosis not present

## 2020-07-13 DIAGNOSIS — S199XXA Unspecified injury of neck, initial encounter: Secondary | ICD-10-CM | POA: Diagnosis present

## 2020-07-13 DIAGNOSIS — E119 Type 2 diabetes mellitus without complications: Secondary | ICD-10-CM | POA: Insufficient documentation

## 2020-07-13 DIAGNOSIS — F17210 Nicotine dependence, cigarettes, uncomplicated: Secondary | ICD-10-CM | POA: Insufficient documentation

## 2020-07-13 MED ORDER — METHOCARBAMOL 500 MG PO TABS: 500.0000 mg | ORAL_TABLET | Freq: Two times a day (BID) | ORAL | 0 refills | Status: DC

## 2020-07-13 MED ORDER — CELECOXIB 200 MG PO CAPS: 200.0000 mg | ORAL_CAPSULE | Freq: Two times a day (BID) | ORAL | 0 refills | Status: DC
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
## 2021-01-31 DIAGNOSIS — E1165 Type 2 diabetes mellitus with hyperglycemia: Secondary | ICD-10-CM | POA: Diagnosis not present

## 2021-01-31 DIAGNOSIS — N184 Chronic kidney disease, stage 4 (severe): Secondary | ICD-10-CM | POA: Diagnosis not present

## 2021-01-31 DIAGNOSIS — Z0000 Encounter for general adult medical examination without abnormal findings: Secondary | ICD-10-CM | POA: Diagnosis not present

## 2021-01-31 DIAGNOSIS — I10 Essential (primary) hypertension: Secondary | ICD-10-CM | POA: Diagnosis not present

## 2021-01-31 DIAGNOSIS — E559 Vitamin D deficiency, unspecified: Secondary | ICD-10-CM | POA: Diagnosis not present

## 2021-01-31 DIAGNOSIS — Z79899 Other long term (current) drug therapy: Secondary | ICD-10-CM | POA: Diagnosis not present

## 2021-02-07 DIAGNOSIS — M109 Gout, unspecified: Secondary | ICD-10-CM | POA: Diagnosis not present

## 2021-02-07 DIAGNOSIS — L0232 Furuncle of buttock: Secondary | ICD-10-CM | POA: Diagnosis not present

## 2021-02-07 DIAGNOSIS — M7021 Olecranon bursitis, right elbow: Secondary | ICD-10-CM | POA: Diagnosis not present

## 2021-02-07 DIAGNOSIS — R252 Cramp and spasm: Secondary | ICD-10-CM | POA: Diagnosis not present

## 2021-03-10 ENCOUNTER — Ambulatory Visit: Payer: Federal, State, Local not specified - PPO | Admitting: Family Medicine

## 2021-05-26 DIAGNOSIS — M5137 Other intervertebral disc degeneration, lumbosacral region: Secondary | ICD-10-CM | POA: Diagnosis not present

## 2021-05-26 DIAGNOSIS — Z79899 Other long term (current) drug therapy: Secondary | ICD-10-CM | POA: Diagnosis not present

## 2021-05-26 DIAGNOSIS — M48062 Spinal stenosis, lumbar region with neurogenic claudication: Secondary | ICD-10-CM | POA: Diagnosis not present

## 2021-05-26 DIAGNOSIS — M5416 Radiculopathy, lumbar region: Secondary | ICD-10-CM | POA: Diagnosis not present

## 2021-05-30 DIAGNOSIS — M5137 Other intervertebral disc degeneration, lumbosacral region: Secondary | ICD-10-CM | POA: Diagnosis not present

## 2021-05-30 DIAGNOSIS — Z79899 Other long term (current) drug therapy: Secondary | ICD-10-CM | POA: Diagnosis not present

## 2021-05-30 DIAGNOSIS — M5416 Radiculopathy, lumbar region: Secondary | ICD-10-CM | POA: Diagnosis not present

## 2021-05-30 DIAGNOSIS — M48062 Spinal stenosis, lumbar region with neurogenic claudication: Secondary | ICD-10-CM | POA: Diagnosis not present

## 2021-06-24 DIAGNOSIS — D649 Anemia, unspecified: Secondary | ICD-10-CM | POA: Diagnosis not present

## 2021-06-24 DIAGNOSIS — R809 Proteinuria, unspecified: Secondary | ICD-10-CM | POA: Diagnosis not present

## 2021-06-24 DIAGNOSIS — M109 Gout, unspecified: Secondary | ICD-10-CM | POA: Diagnosis not present

## 2021-06-24 DIAGNOSIS — R309 Painful micturition, unspecified: Secondary | ICD-10-CM | POA: Diagnosis not present

## 2021-06-24 DIAGNOSIS — N189 Chronic kidney disease, unspecified: Secondary | ICD-10-CM | POA: Diagnosis not present

## 2021-06-24 DIAGNOSIS — E211 Secondary hyperparathyroidism, not elsewhere classified: Secondary | ICD-10-CM | POA: Diagnosis not present

## 2021-06-24 DIAGNOSIS — E559 Vitamin D deficiency, unspecified: Secondary | ICD-10-CM | POA: Diagnosis not present

## 2021-08-07 IMAGING — CR LUMBAR SPINE - COMPLETE 4+ VIEW
5 series · 5 of 5 positions shown · non-contrast
Comparison: February 07, 2017

CLINICAL DATA: Pain following fall

EXAM:
LUMBAR SPINE - COMPLETE 4+ VIEW

[t l-spine a.p.]
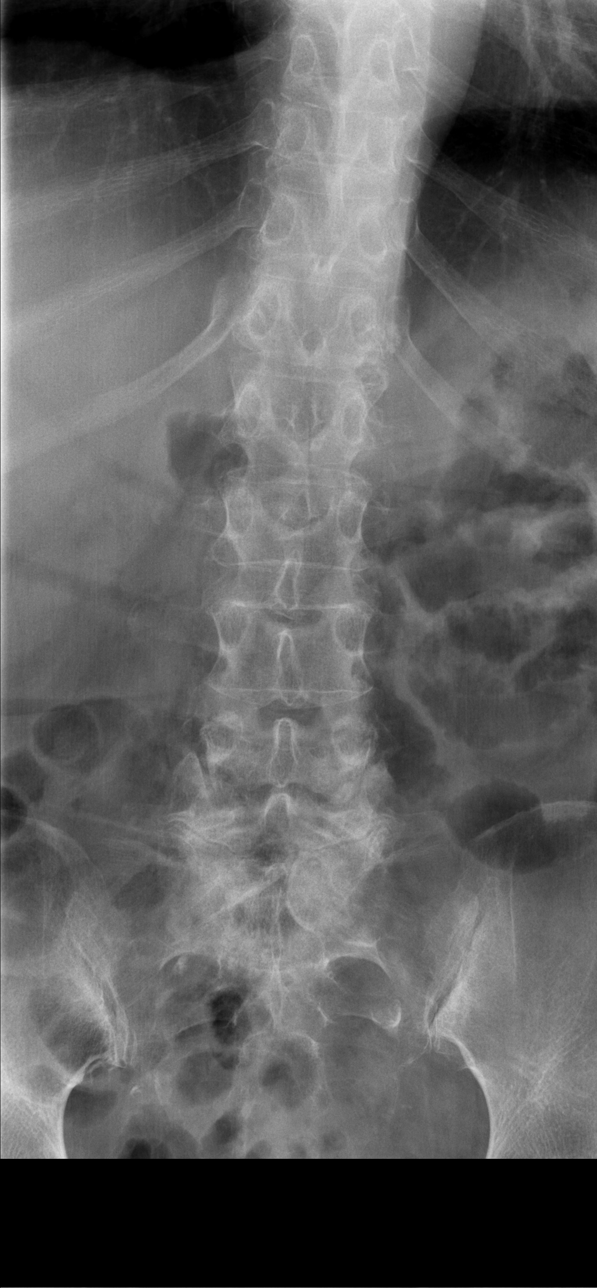

[t l-spine oblique exposure (1 of 2)]
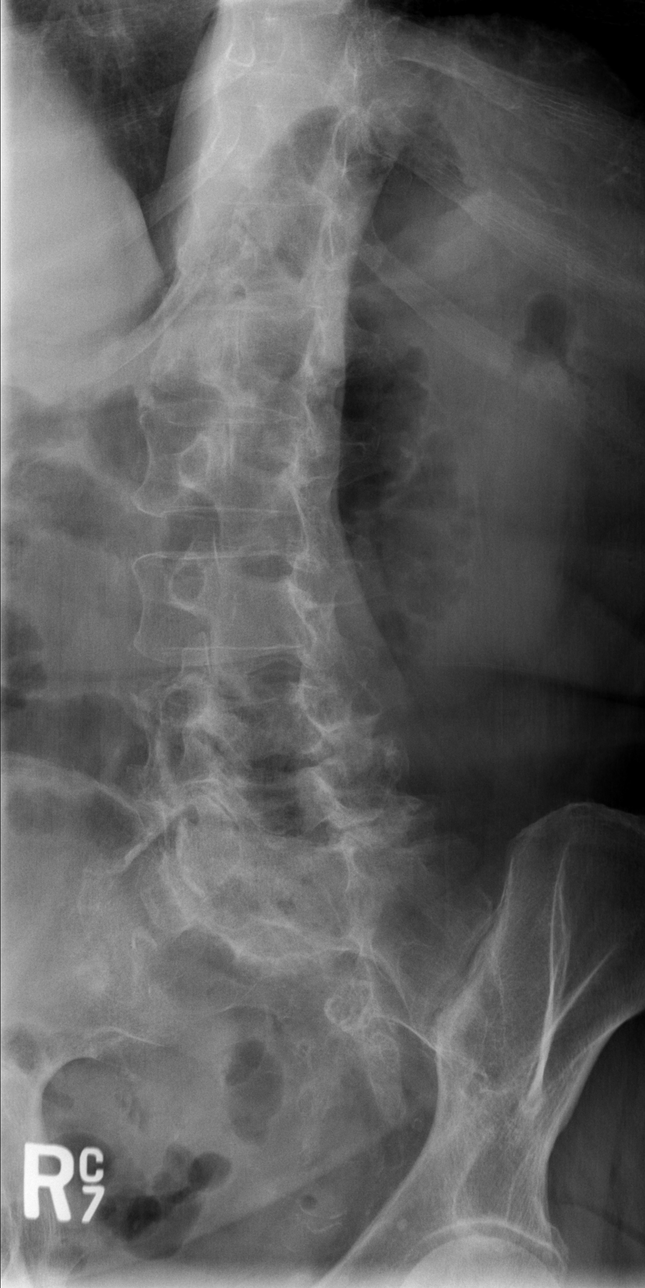

[t l-spine oblique exposure (2 of 2)]
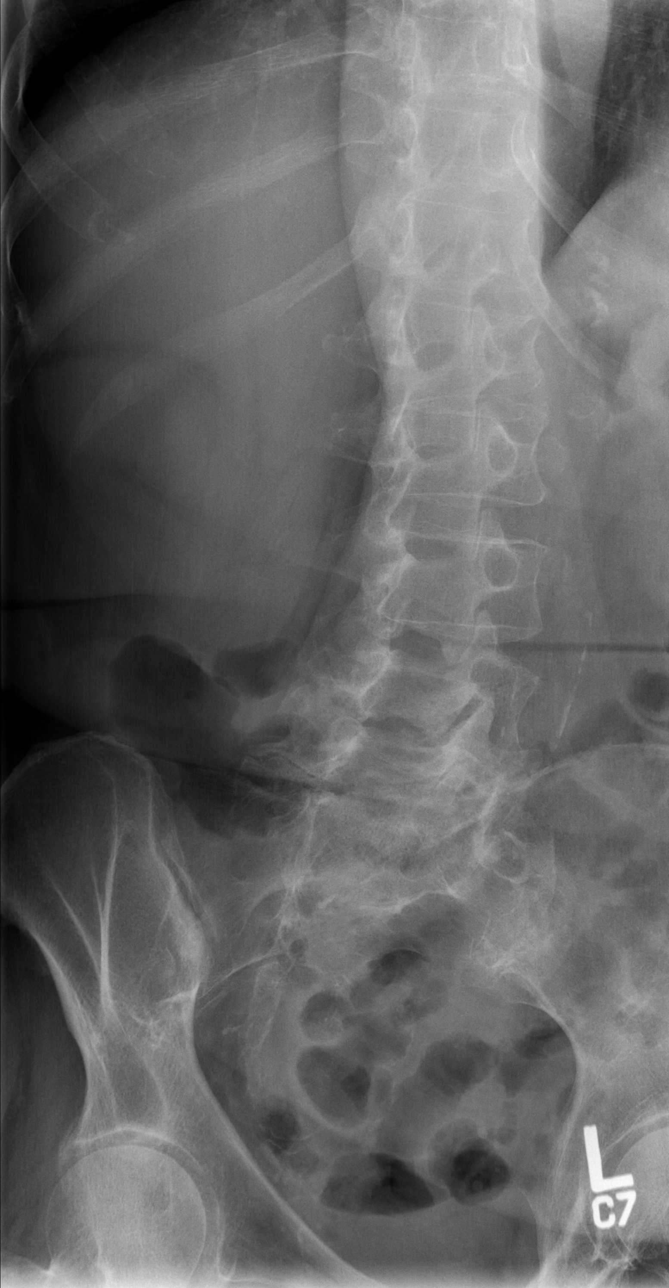

[t l-spine lat]
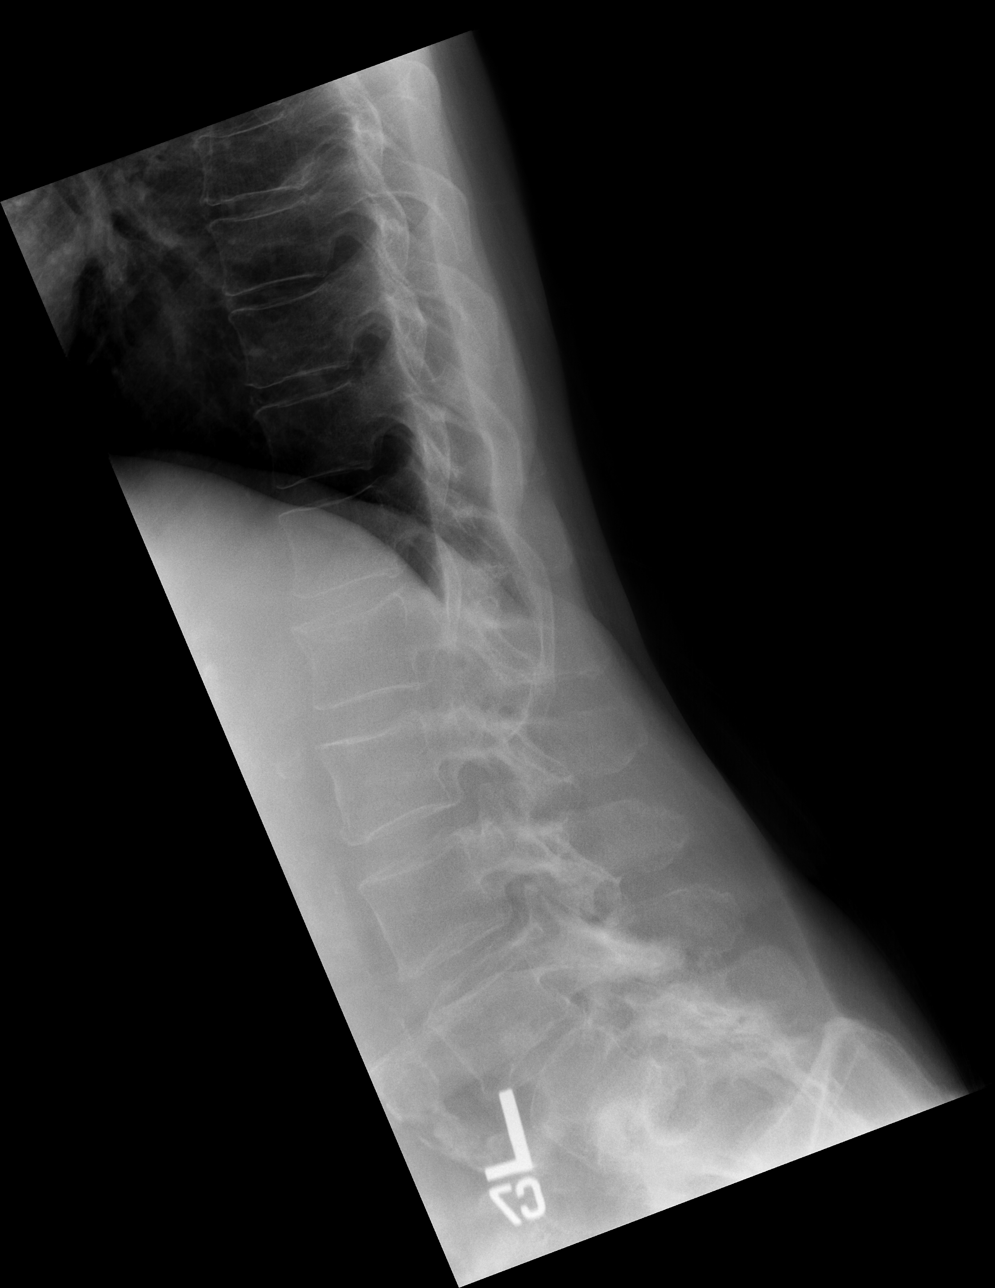

[t l-spine l5-s1 spot]
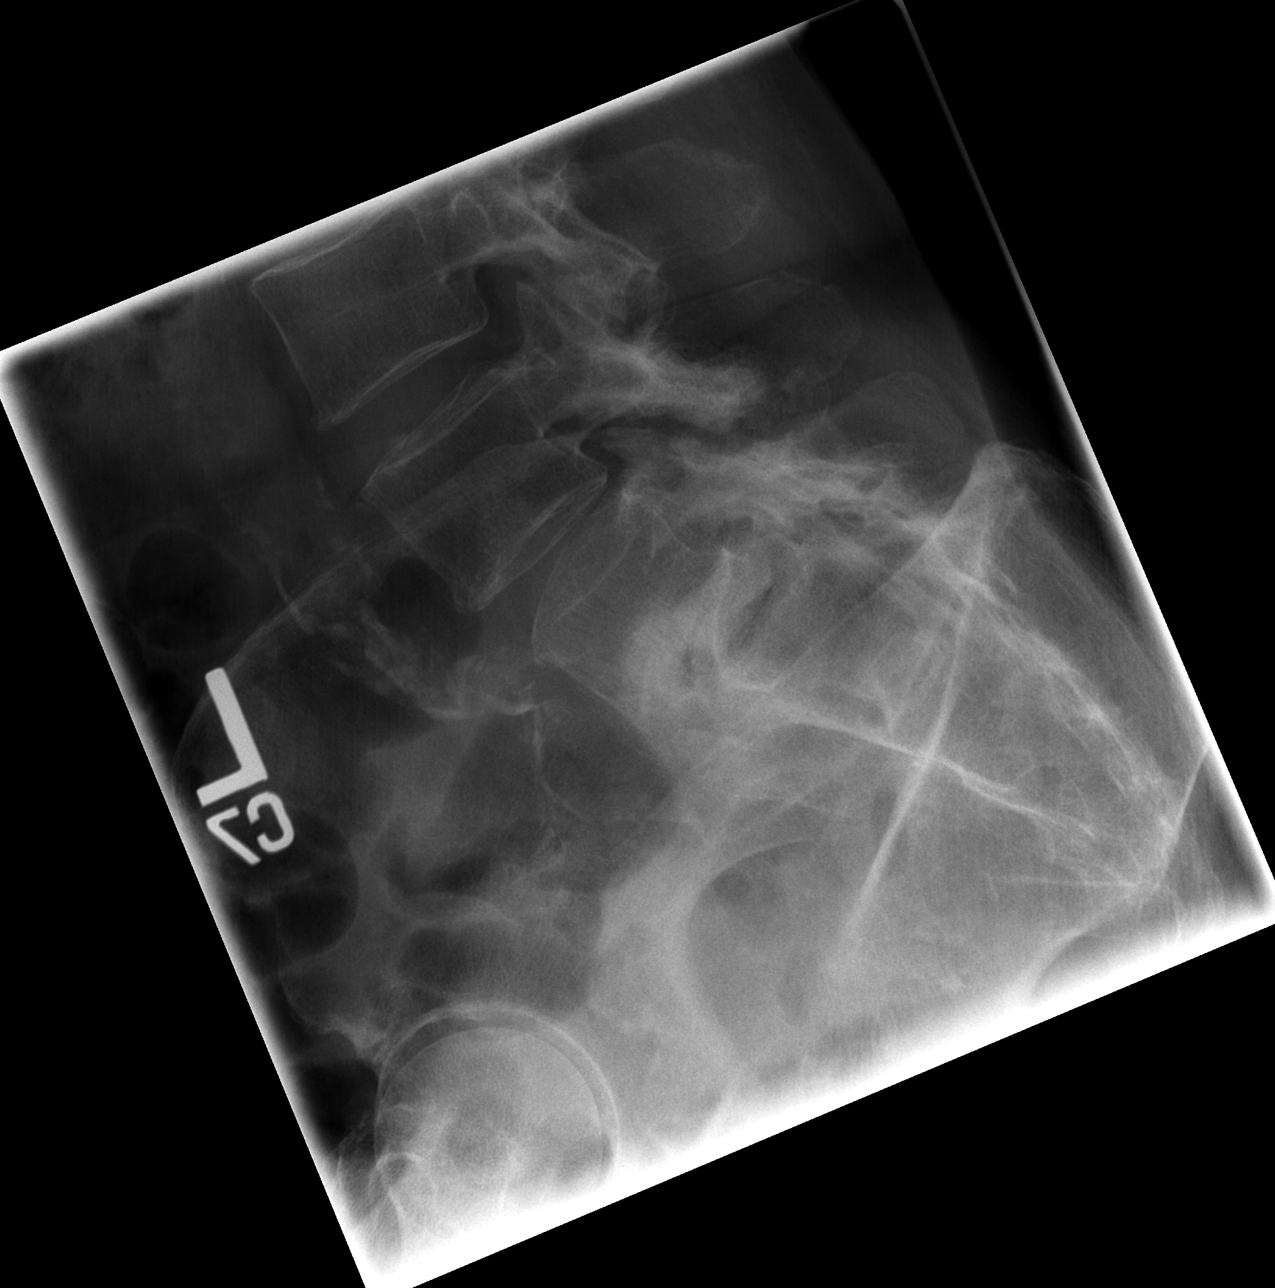

[5 of 5 positions shown; findings below may reference images not displayed]

FINDINGS: Frontal, lateral, spot lumbosacral lateral, and bilateral oblique
views were obtained. There are 5 non-rib-bearing lumbar type
vertebral bodies. There is no appreciable fracture. There is 1.1 cm
of anterolisthesis of L5 on S1, a finding also present on prior
study. No other spondylolisthesis evident. There is moderate disc
space narrowing at L4-5 with severe disc space narrowing at L5-S1.
There is bony eburnation along the inferior aspect of the L5
vertebral body. There is extensive facet osteoarthritic change at
L5-S1 bilaterally. There is also osteoarthritic change to a lesser
degree at all other facet regions. There is aortic atherosclerosis.
IMPRESSION: No fracture. Persistent spondylolisthesis at L5-S1, felt to be due
to underlying spondylosis. No pars defects are demonstrated on this
study. There is marked disc space narrowing at L5-S1 with bony
eburnation along the inferior aspect of the L5 vertebral body. There
is moderate disc space narrowing at L4-5. There is facet
osteoarthritic change at multiple levels, most severe at L5-S1
bilaterally. In comparison with prior study, there has been
progression of arthropathy at L4-5.

Aortic Atherosclerosis ([id number]).

## 2021-08-18 DIAGNOSIS — M5137 Other intervertebral disc degeneration, lumbosacral region: Secondary | ICD-10-CM | POA: Diagnosis not present

## 2021-08-18 DIAGNOSIS — M48062 Spinal stenosis, lumbar region with neurogenic claudication: Secondary | ICD-10-CM | POA: Diagnosis not present

## 2021-08-18 DIAGNOSIS — M5416 Radiculopathy, lumbar region: Secondary | ICD-10-CM | POA: Diagnosis not present

## 2021-11-24 DIAGNOSIS — M5137 Other intervertebral disc degeneration, lumbosacral region: Secondary | ICD-10-CM | POA: Diagnosis not present

## 2021-11-24 DIAGNOSIS — M48062 Spinal stenosis, lumbar region with neurogenic claudication: Secondary | ICD-10-CM | POA: Diagnosis not present

## 2021-11-24 DIAGNOSIS — Z79899 Other long term (current) drug therapy: Secondary | ICD-10-CM | POA: Diagnosis not present

## 2021-11-24 DIAGNOSIS — M5416 Radiculopathy, lumbar region: Secondary | ICD-10-CM | POA: Diagnosis not present

## 2022-02-15 DIAGNOSIS — E559 Vitamin D deficiency, unspecified: Secondary | ICD-10-CM | POA: Diagnosis not present

## 2022-02-15 DIAGNOSIS — E1169 Type 2 diabetes mellitus with other specified complication: Secondary | ICD-10-CM | POA: Diagnosis not present

## 2022-02-15 DIAGNOSIS — N183 Chronic kidney disease, stage 3 unspecified: Secondary | ICD-10-CM | POA: Diagnosis not present

## 2022-02-15 DIAGNOSIS — I10 Essential (primary) hypertension: Secondary | ICD-10-CM | POA: Diagnosis not present

## 2022-02-15 DIAGNOSIS — E785 Hyperlipidemia, unspecified: Secondary | ICD-10-CM | POA: Diagnosis not present

## 2022-02-22 DIAGNOSIS — M5416 Radiculopathy, lumbar region: Secondary | ICD-10-CM | POA: Diagnosis not present

## 2022-02-22 DIAGNOSIS — M5137 Other intervertebral disc degeneration, lumbosacral region: Secondary | ICD-10-CM | POA: Diagnosis not present

## 2022-02-22 DIAGNOSIS — M48062 Spinal stenosis, lumbar region with neurogenic claudication: Secondary | ICD-10-CM | POA: Diagnosis not present

## 2022-05-25 DIAGNOSIS — M5137 Other intervertebral disc degeneration, lumbosacral region: Secondary | ICD-10-CM | POA: Diagnosis not present

## 2022-05-25 DIAGNOSIS — M48062 Spinal stenosis, lumbar region with neurogenic claudication: Secondary | ICD-10-CM | POA: Diagnosis not present

## 2022-05-25 DIAGNOSIS — M5416 Radiculopathy, lumbar region: Secondary | ICD-10-CM | POA: Diagnosis not present

## 2022-05-25 DIAGNOSIS — Z79899 Other long term (current) drug therapy: Secondary | ICD-10-CM | POA: Diagnosis not present

## 2022-08-24 DIAGNOSIS — M5137 Other intervertebral disc degeneration, lumbosacral region: Secondary | ICD-10-CM | POA: Diagnosis not present

## 2022-08-24 DIAGNOSIS — M5416 Radiculopathy, lumbar region: Secondary | ICD-10-CM | POA: Diagnosis not present

## 2022-08-24 DIAGNOSIS — M48062 Spinal stenosis, lumbar region with neurogenic claudication: Secondary | ICD-10-CM | POA: Diagnosis not present

## 2022-09-10 DIAGNOSIS — M62838 Other muscle spasm: Secondary | ICD-10-CM | POA: Diagnosis not present

## 2022-09-10 DIAGNOSIS — M542 Cervicalgia: Secondary | ICD-10-CM | POA: Diagnosis not present

## 2022-09-12 ENCOUNTER — Encounter (HOSPITAL_BASED_OUTPATIENT_CLINIC_OR_DEPARTMENT_OTHER): Payer: Self-pay | Admitting: Urology

## 2022-09-12 ENCOUNTER — Emergency Department (HOSPITAL_BASED_OUTPATIENT_CLINIC_OR_DEPARTMENT_OTHER): Payer: Medicare HMO

## 2022-09-12 ENCOUNTER — Emergency Department (HOSPITAL_BASED_OUTPATIENT_CLINIC_OR_DEPARTMENT_OTHER)
Admission: EM | Admit: 2022-09-12 | Discharge: 2022-09-12 | Disposition: A | Discharge status: Home / Self Care | Payer: Medicare HMO | Attending: Emergency Medicine | Admitting: Emergency Medicine

## 2022-09-12 DIAGNOSIS — S161XXA Strain of muscle, fascia and tendon at neck level, initial encounter: Secondary | ICD-10-CM | POA: Insufficient documentation

## 2022-09-12 DIAGNOSIS — M47812 Spondylosis without myelopathy or radiculopathy, cervical region: Secondary | ICD-10-CM | POA: Insufficient documentation

## 2022-09-12 DIAGNOSIS — R519 Headache, unspecified: Secondary | ICD-10-CM | POA: Insufficient documentation

## 2022-09-12 DIAGNOSIS — Z951 Presence of aortocoronary bypass graft: Secondary | ICD-10-CM | POA: Diagnosis not present

## 2022-09-12 DIAGNOSIS — E119 Type 2 diabetes mellitus without complications: Secondary | ICD-10-CM | POA: Insufficient documentation

## 2022-09-12 DIAGNOSIS — Z79899 Other long term (current) drug therapy: Secondary | ICD-10-CM | POA: Insufficient documentation

## 2022-09-12 DIAGNOSIS — Y92410 Unspecified street and highway as the place of occurrence of the external cause: Secondary | ICD-10-CM | POA: Diagnosis not present

## 2022-09-12 DIAGNOSIS — I10 Essential (primary) hypertension: Secondary | ICD-10-CM | POA: Insufficient documentation

## 2022-09-12 DIAGNOSIS — M542 Cervicalgia: Secondary | ICD-10-CM | POA: Diagnosis not present

## 2022-09-12 DIAGNOSIS — Z7984 Long term (current) use of oral hypoglycemic drugs: Secondary | ICD-10-CM | POA: Diagnosis not present
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
## 2022-09-27 DIAGNOSIS — E785 Hyperlipidemia, unspecified: Secondary | ICD-10-CM | POA: Diagnosis not present

## 2022-09-27 DIAGNOSIS — E1169 Type 2 diabetes mellitus with other specified complication: Secondary | ICD-10-CM | POA: Diagnosis not present

## 2022-09-27 DIAGNOSIS — D6489 Other specified anemias: Secondary | ICD-10-CM | POA: Diagnosis not present

## 2022-09-27 DIAGNOSIS — R309 Painful micturition, unspecified: Secondary | ICD-10-CM | POA: Diagnosis not present

## 2022-09-27 DIAGNOSIS — E559 Vitamin D deficiency, unspecified: Secondary | ICD-10-CM | POA: Diagnosis not present

## 2022-09-27 DIAGNOSIS — R809 Proteinuria, unspecified: Secondary | ICD-10-CM | POA: Diagnosis not present

## 2022-09-27 DIAGNOSIS — I10 Essential (primary) hypertension: Secondary | ICD-10-CM | POA: Diagnosis not present

## 2022-09-27 DIAGNOSIS — N189 Chronic kidney disease, unspecified: Secondary | ICD-10-CM | POA: Diagnosis not present

## 2022-09-27 DIAGNOSIS — N183 Chronic kidney disease, stage 3 unspecified: Secondary | ICD-10-CM | POA: Diagnosis not present

## 2022-10-16 ENCOUNTER — Ambulatory Visit: Payer: Medicare HMO | Admitting: Family Medicine

## 2022-10-30 ENCOUNTER — Encounter: Payer: Self-pay | Admitting: Family Medicine

## 2022-11-01 ENCOUNTER — Ambulatory Visit: Payer: Medicare HMO | Admitting: Family Medicine

## 2022-11-07 ENCOUNTER — Telehealth: Payer: Self-pay

## 2022-11-07 NOTE — Telephone Encounter (Signed)
Large stack of patient's [facility] records received. Pt has cancelled two NP appointments with our office. Called the pt to inquire if she has established with a PCP in another office or if she plans to establish here at [facility] before we discard her records. Unable to LVM, vm not set up. Will attempt to call the patient at a later time.

## 2022-11-07 NOTE — Telephone Encounter (Signed)
2nd attempt to reach patient.

## 2022-11-08 NOTE — Telephone Encounter (Signed)
3rd attempt to contact pt. Unable to lvm.

## 2022-11-23 DIAGNOSIS — Z79899 Other long term (current) drug therapy: Secondary | ICD-10-CM | POA: Diagnosis not present

## 2022-11-23 DIAGNOSIS — M5137 Other intervertebral disc degeneration, lumbosacral region: Secondary | ICD-10-CM | POA: Diagnosis not present

## 2022-11-23 DIAGNOSIS — M48062 Spinal stenosis, lumbar region with neurogenic claudication: Secondary | ICD-10-CM | POA: Diagnosis not present

## 2022-11-29 DIAGNOSIS — Z79899 Other long term (current) drug therapy: Secondary | ICD-10-CM | POA: Diagnosis not present

## 2023-02-23 DIAGNOSIS — M48062 Spinal stenosis, lumbar region with neurogenic claudication: Secondary | ICD-10-CM | POA: Diagnosis not present

## 2023-02-23 DIAGNOSIS — G894 Chronic pain syndrome: Secondary | ICD-10-CM | POA: Diagnosis not present

## 2023-02-23 DIAGNOSIS — M51372 Other intervertebral disc degeneration, lumbosacral region with discogenic back pain and lower extremity pain: Secondary | ICD-10-CM | POA: Diagnosis not present

## 2023-05-29 DIAGNOSIS — G894 Chronic pain syndrome: Secondary | ICD-10-CM | POA: Diagnosis not present

## 2023-05-29 DIAGNOSIS — M48062 Spinal stenosis, lumbar region with neurogenic claudication: Secondary | ICD-10-CM | POA: Diagnosis not present

## 2023-05-29 DIAGNOSIS — Z79899 Other long term (current) drug therapy: Secondary | ICD-10-CM | POA: Diagnosis not present

## 2023-05-29 DIAGNOSIS — M51372 Other intervertebral disc degeneration, lumbosacral region with discogenic back pain and lower extremity pain: Secondary | ICD-10-CM | POA: Diagnosis not present

## 2023-08-27 DIAGNOSIS — M48062 Spinal stenosis, lumbar region with neurogenic claudication: Secondary | ICD-10-CM | POA: Diagnosis not present

## 2023-08-27 DIAGNOSIS — M5416 Radiculopathy, lumbar region: Secondary | ICD-10-CM | POA: Diagnosis not present

## 2023-08-27 DIAGNOSIS — M51372 Other intervertebral disc degeneration, lumbosacral region with discogenic back pain and lower extremity pain: Secondary | ICD-10-CM | POA: Diagnosis not present

## 2023-08-27 DIAGNOSIS — G894 Chronic pain syndrome: Secondary | ICD-10-CM | POA: Diagnosis not present

## 2023-10-17 ENCOUNTER — Ambulatory Visit: Payer: Self-pay | Admitting: Family Medicine

## 2023-10-17 ENCOUNTER — Ambulatory Visit (INDEPENDENT_AMBULATORY_CARE_PROVIDER_SITE_OTHER): Admitting: Family Medicine

## 2023-10-17 ENCOUNTER — Encounter: Payer: Self-pay | Admitting: Family Medicine

## 2023-10-17 VITALS — BP 142/74 | HR 82 | Temp 97.2°F | Ht 65.0 in | Wt 152.6 lb

## 2023-10-17 DIAGNOSIS — E1169 Type 2 diabetes mellitus with other specified complication: Secondary | ICD-10-CM

## 2023-10-17 DIAGNOSIS — I10 Essential (primary) hypertension: Secondary | ICD-10-CM

## 2023-10-17 DIAGNOSIS — N183 Chronic kidney disease, stage 3 unspecified: Secondary | ICD-10-CM | POA: Diagnosis not present

## 2023-10-17 DIAGNOSIS — E782 Mixed hyperlipidemia: Secondary | ICD-10-CM | POA: Diagnosis not present

## 2023-10-17 DIAGNOSIS — F17210 Nicotine dependence, cigarettes, uncomplicated: Secondary | ICD-10-CM | POA: Diagnosis not present

## 2023-10-17 DIAGNOSIS — I2510 Atherosclerotic heart disease of native coronary artery without angina pectoris: Secondary | ICD-10-CM | POA: Diagnosis not present

## 2023-10-17 DIAGNOSIS — M1A9XX1 Chronic gout, unspecified, with tophus (tophi): Secondary | ICD-10-CM

## 2023-10-17 DIAGNOSIS — G894 Chronic pain syndrome: Secondary | ICD-10-CM

## 2023-10-17 DIAGNOSIS — F17200 Nicotine dependence, unspecified, uncomplicated: Secondary | ICD-10-CM

## 2023-10-17 DIAGNOSIS — Z1231 Encounter for screening mammogram for malignant neoplasm of breast: Secondary | ICD-10-CM

## 2023-10-17 DIAGNOSIS — Z1211 Encounter for screening for malignant neoplasm of colon: Secondary | ICD-10-CM

## 2023-10-17 LAB — LIPID PANEL
Cholesterol: 216 mg/dL — ABNORMAL HIGH (ref 0–200)
HDL: 45.5 mg/dL (ref >39.00)
LDL Cholesterol: 150 mg/dL — ABNORMAL HIGH (ref 0–99)
NonHDL: 170.79
Total CHOL/HDL Ratio: 5
Triglycerides: 103 mg/dL (ref 0.0–149.0)
VLDL: 20.6 mg/dL (ref 0.0–40.0)

## 2023-10-17 LAB — COMPREHENSIVE METABOLIC PANEL WITH GFR
ALT: 11 U/L (ref 0–35)
AST: 20 U/L (ref 0–37)
Albumin: 4.6 g/dL (ref 3.5–5.2)
Alkaline Phosphatase: 76 U/L (ref 39–117)
BUN: 25 mg/dL — ABNORMAL HIGH (ref 6–23)
CO2: 26 meq/L (ref 19–32)
Calcium: 10.3 mg/dL (ref 8.4–10.5)
Chloride: 101 meq/L (ref 96–112)
Creatinine, Ser: 1.44 mg/dL — ABNORMAL HIGH (ref 0.40–1.20)
GFR: 37.57 mL/min — ABNORMAL LOW (ref >60.00)
Glucose, Bld: 120 mg/dL — ABNORMAL HIGH (ref 70–99)
Potassium: 3.9 meq/L (ref 3.5–5.1)
Sodium: 137 meq/L (ref 135–145)
Total Bilirubin: 0.4 mg/dL (ref 0.2–1.2)
Total Protein: 8.4 g/dL — ABNORMAL HIGH (ref 6.0–8.3)

## 2023-10-17 LAB — HEMOGLOBIN A1C: Hgb A1c MFr Bld: 6.4 % (ref 4.6–6.5)

## 2023-10-17 LAB — URIC ACID: Uric Acid, Serum: 9.7 mg/dL — ABNORMAL HIGH (ref 2.4–7.0)

## 2023-10-17 MED ORDER — ROSUVASTATIN CALCIUM 10 MG PO TABS: 10.0000 mg | ORAL_TABLET | Freq: Every day | ORAL | 3 refills | Status: AC

## 2023-10-17 MED ORDER — ALLOPURINOL 300 MG PO TABS
300.0000 mg | ORAL_TABLET | Freq: Every day | ORAL | 3 refills | Status: AC
Start: 2023-10-17 — End: ?

## 2023-10-17 NOTE — Progress Notes (Signed)
 [facility] [address] [place] [address] Dept: [phone] Dept Fax: [phone]  New Patient Office Visit  Subjective:    Patient ID: Gail Humphrey, female    DOB: 12/12/1955, 68 y.o..   MRN: [id number]  Chief Complaint  Patient presents with   Establish Care    [facility]- establish care.   C/o having issues with the RT big toe.     History of Present Illness:  Patient is in today to establish care. Gail Humphrey was born in [place], [place]. Her family moved to [facility] [place] when she was 68 years old. After graduation, she lived in [place], [place] and [place], [place] for a period of time. She moved back to [place] in 1986. She attended some vocational training, including attending [employer]. At the end of her career, she worked for 10 years for [employer] at [employer]. Gail Humphrey has been a widow since 48. She has a daughter (53) who lives with her. Gail Humphrey smokes ~ 1/2 ppd of cigarettes. She drinks 2-3 drinks per week. She denies drug use.  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 She is not currently on any specific treatment for this.  Gail Humphrey has a history of hypertension. She is managed on olmesartan-amlodipine-HCTZ 20-5-12.5 mg daily. She notes she took her daily dose right before she left home.  Gail Humphrey has a history of Type 2 diabetes. She states she was told 2 years ago that she no longer had diabetes and that she could stop her medicines. However, a review of records shows she was being prescribed sitagliptin-metformin (Janumet) 50-1,000 mg daily in April.  Gail Humphrey has a history of hyperlipidemia, but is on no medication for this.  Gail Humphrey has a history of chronic low back pain due to intervertebral disc disease and lumbar radiculopathy. She is managed on Percocet 4 tabs daily and methocarbamol  500 mg q 6 hours PRN. This is  managed by Moishe Angel, PA-C ([facility]).  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 She has had a recent flare of her right 1st MTP joint. She notes this was swollen and tender back in April and eventually ruptured. She has tophaceous deposits around several joints.  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   Hypomagnesemia 09/27/2022   Vitamin D deficiency 02/04/2020   Type 2 diabetes mellitus with other specified complication (HCC) 02/04/2020   Hyperlipidemia 02/04/2020   Chronic kidney disease, stage 3 unspecified (HCC) 02/04/2020   Presence of aortocoronary bypass graft 04/23/2017   Coronary artery disease 04/23/2017   Other intervertebral disc degeneration, lumbosacral region 07/28/2013   Spinal stenosis of lumbar region 07/28/2013   Lumbar radiculopathy 07/28/2013   Essential hypertension 04/16/2013   Chronic pain 04/16/2013   Past Surgical History:  Procedure Laterality Date   ABDOMINAL HYSTERECTOMY     CARDIAC SURGERY     CORONARY ARTERY BYPASS GRAFT  4 vessel   History reviewed. No pertinent family history. Outpatient Medications Prior to Visit  Medication Sig Dispense Refill   cyanocobalamin (VITAMIN B12) 500 MCG tablet Take 500 mcg by mouth daily.     magnesium oxide (MAG-OX) 400 (240 Mg) MG tablet Take 1 tablet by mouth daily.     methocarbamol  (ROBAXIN ) 500 MG tablet Take 500 mg by mouth every 6 (six) hours as needed.  Olmesartan-Amlodipine-HCTZ (TRIBENZOR PO) Take by mouth.     oxyCODONE -acetaminophen  (PERCOCET) 10-325 MG tablet      celecoxib  (CELEBREX ) 200 MG capsule Take 1 capsule (200 mg total) by mouth 2 (two) times daily. 20 capsule 0   methocarbamol  (ROBAXIN ) 500 MG tablet Take 1 tablet (500 mg total) by mouth 2 (two) times daily. (Patient not taking: Reported on 10/17/2023) 20 tablet 0   naloxone (NARCAN) nasal spray 4 mg/0.1 mL Place into the nose.     Omega-3 Fatty Acids  (FISH OIL PO) Take by mouth.     predniSONE  (DELTASONE ) 20 MG tablet 3 Tabs PO Days 1-3, then 2 tabs PO Days 4-6, then 1 tab PO Day 7-9, then Half Tab PO Day 10-12 20 tablet 0   SITagliptin-MetFORMIN HCl (JANUMET PO) Take by mouth.     No facility-administered medications prior to visit.   Allergies  Allergen Reactions   Gabapentin    Penicillins    Objective:   Today's Vitals   10/17/23 1311 10/17/23 1412  BP: (!) 150/82 (!) 142/74  Pulse: 82   Temp: (!) 97.2 F (36.2 C)   TempSrc: Temporal   SpO2: 99%   Weight: 152 lb 9.6 oz (69.2 kg)   Height: 5' 5 (1.651 m)    Body mass index is 25.39 kg/m.   General: Well developed, well nourished. No acute distress. Extremities: Marked enlargement of both 1st MTP joints with a small healing ulceration on the right. There are swollen areas   over the elbows and dorsal aspects of the forearms and around multiple finger joints c/w tophi. Psych: Alert and oriented. Normal mood and affect.  Health Maintenance Due  Topic Date Due   FOOT EXAM  Never done   OPHTHALMOLOGY EXAM  Never done   Diabetic kidney evaluation - Urine ACR  Never done   Hepatitis C Screening  Never done   DTaP/Tdap/Td (1 - Tdap) Never done   Pneumonia Vaccine 19+ Years old (1 of 2 - PCV) Never done   Zoster Vaccines- Shingrix (1 of 2) Never done   Colonoscopy  Never done   HEMOGLOBIN A1C  07/26/2009   MAMMOGRAM  02/05/2014   Diabetic kidney evaluation - eGFR measurement  01/02/2016   Medicare Annual Wellness (AWV)  01/31/2022     Assessment & Plan:   Problem List Items Addressed This Visit       Cardiovascular and Mediastinum   Coronary artery disease   Past history of an MI with a 4-v CABG. Patient continues to smoke. She is not on any therapy. Likely should be taking at least an aspirin . She notes she does not see cardiology. I strongly advise smoking cessation.      Essential hypertension - Primary   Blood pressure is elevated today, though had  improved some by the time Gail Humphrey left. Likely due to taking her medication late. I will check renal labs and electrolytes. I will continue olmesartan0amlodipine-HCTZ (Tribenzor) 20-5-12.5 mg daily.       Relevant Orders   Comprehensive metabolic panel with GFR (Completed)     Endocrine   Type 2 diabetes mellitus with other specified complication [facility])   Although Ms. Ellerman states she was told her diabetes had resolved due to her weight loss, I see medical record evidence that she was on medication a few months ago. I will check annual DM labs to see if she has any evidence of ongoing diabetes.      Relevant Orders   Hemoglobin A1c (  Completed)   Comprehensive metabolic panel with GFR (Completed)     Genitourinary   Chronic kidney disease, stage 3 unspecified (HCC)   It appears Ms. Ensz has not seen her nephrologist in over a year. I will check renal labs today. Recommend focus on blood pressure and glucose control, adequate hydration, and avoidance of nephrotoxic medications. She should continue her ARB.       Relevant Orders   Microalbumin / creatinine urine ratio   Comprehensive metabolic panel with GFR (Completed)     Other   Chronic pain   Continue to follow with Ms. Dalia Ducking for chronic opioid therapy.      Relevant Medications   methocarbamol  (ROBAXIN ) 500 MG tablet   Hyperlipidemia   In light of smoking and past CVD, I will check lipids today. I suspect that she should be on statin therapy.      Relevant Orders   Lipid panel (Completed)   Tobacco use disorder, mild, abuse   Strongly recommend complete smoking cessation. We discussed use of NRT with patches and gum. If she is unable to quit on her own, I offered other assistance.  I spent 5 minutes counseling the patient about tobacco cessation.       Tophaceous gout   Significant tophaceous gout. Ms. Latella notes she was prescribed allopurinol, but she does not take this except for flares. I will check a  uric acid level today. I will refer her to rheumatology for assistance in management.      Relevant Orders   Comprehensive metabolic panel with GFR (Completed)   Uric acid (Completed)   Ambulatory referral to Rheumatology   Other Visit Diagnoses       Screening for colon cancer       Relevant Orders   Cologuard     Encounter for screening mammogram for malignant neoplasm of breast       Relevant Orders   MM DIGITAL SCREENING BILATERAL       Return in about 3 months (around 01/17/2024) for Reassessment.   Graig Lawyer, MD

## 2023-10-17 NOTE — Assessment & Plan Note (Signed)
 Continue to follow with Ms. Gail Humphrey for chronic opioid therapy.

## 2023-10-17 NOTE — Assessment & Plan Note (Signed)
 In light of smoking and past CVD, I will check lipids today. I suspect that she should be on statin therapy.

## 2023-10-17 NOTE — Assessment & Plan Note (Signed)
 Past history of an MI with a 4-v CABG. Patient continues to smoke. She is not on any therapy. Likely should be taking at least an aspirin . She notes she does not see cardiology. I strongly advise smoking cessation.

## 2023-10-17 NOTE — Assessment & Plan Note (Signed)
 Although Gail Humphrey states she was told her diabetes had resolved due to her weight loss, I see medical record evidence that she was on medication a few months ago. I will check annual DM labs to see if she has any evidence of ongoing diabetes.

## 2023-10-17 NOTE — Assessment & Plan Note (Signed)
 Significant tophaceous gout. Gail Humphrey notes she was prescribed allopurinol, but she does not take this except for flares. I will check a uric acid level today. I will refer her to rheumatology for assistance in management.

## 2023-10-17 NOTE — Assessment & Plan Note (Signed)
 It appears Gail Humphrey has not seen her nephrologist in over a year. I will check renal labs today. Recommend focus on blood pressure and glucose control, adequate hydration, and avoidance of nephrotoxic medications. She should continue her ARB.

## 2023-10-17 NOTE — Assessment & Plan Note (Signed)
 Strongly recommend complete smoking cessation. We discussed use of NRT with patches and gum. If she is unable to quit on her own, I offered other assistance.  I spent 5 minutes counseling the patient about tobacco cessation.

## 2023-10-17 NOTE — Assessment & Plan Note (Signed)
 Blood pressure is elevated today, though had improved some by the time Gail Humphrey left. Likely due to taking her medication late. I will check renal labs and electrolytes. I will continue olmesartan0amlodipine-HCTZ (Tribenzor) 20-5-12.5 mg daily.

## 2023-10-23 DIAGNOSIS — Z1211 Encounter for screening for malignant neoplasm of colon: Secondary | ICD-10-CM | POA: Diagnosis not present

## 2023-10-27 LAB — COLOGUARD: COLOGUARD: NEGATIVE

## 2023-11-26 DIAGNOSIS — Z79899 Other long term (current) drug therapy: Secondary | ICD-10-CM | POA: Diagnosis not present

## 2023-11-26 DIAGNOSIS — G894 Chronic pain syndrome: Secondary | ICD-10-CM | POA: Diagnosis not present

## 2023-11-26 DIAGNOSIS — M48062 Spinal stenosis, lumbar region with neurogenic claudication: Secondary | ICD-10-CM | POA: Diagnosis not present

## 2023-11-26 NOTE — Telephone Encounter (Signed)
 [facility]  Established Patient Screening Note  Gail Humphrey is a 68 y.o. old female presenting for return patient visit.  Pain Description: Since the last visit the pain is worse. The most significant location of pain is the lower back and bilateral hip. Other areas of pain include the Generalized body pain.  The 1-2 words that best describe the pain: aching The pain improves with over the counter medications and prescription pain medications. The pain is made worse with most movements and general daily activities. The average daily pain score is 8/10. The pain can be as high as 10/10 at its worst. The pain interferes with: All activities.  Therapies: Did the patient have an injection/procedure since the last visit? No  - Percentage of pain relief from injection/procedure: Not applicable  Has the patient had any new imaging (X-ray, MRI, CT) for pain concerns since the last visit? No  Has physical therapy been initiated or completed for this pain concern? No  Medications: Was a new medication for pain started by our clinic at the last visit? No   Is the patient on a blood thinner (including aspirin , Goody/BC/Bayer powder)? No  - Name of anticoagulant/blood thinner: Not applicable

## 2023-11-27 DIAGNOSIS — Z79899 Other long term (current) drug therapy: Secondary | ICD-10-CM | POA: Diagnosis not present

## 2023-11-30 ENCOUNTER — Telehealth: Payer: Self-pay

## 2023-11-30 NOTE — Telephone Encounter (Signed)
 Copied from CRM #[id number]. Topic: General - Other >> Nov 30, 2023  8:29 AM Revonda D wrote: Reason for CRM: Pt stated that she had a missed call from the office and is returning the call. Pt stated it was a voicemail from [place] informing her to give the office a call.

## 2023-12-24 DIAGNOSIS — Z6824 Body mass index (BMI) 24.0-24.9, adult: Secondary | ICD-10-CM | POA: Diagnosis not present

## 2023-12-24 DIAGNOSIS — M79642 Pain in left hand: Secondary | ICD-10-CM | POA: Diagnosis not present

## 2023-12-24 DIAGNOSIS — M2540 Effusion, unspecified joint: Secondary | ICD-10-CM | POA: Diagnosis not present

## 2023-12-24 DIAGNOSIS — M79641 Pain in right hand: Secondary | ICD-10-CM | POA: Diagnosis not present

## 2023-12-24 DIAGNOSIS — M79672 Pain in left foot: Secondary | ICD-10-CM | POA: Diagnosis not present

## 2023-12-24 DIAGNOSIS — M79671 Pain in right foot: Secondary | ICD-10-CM | POA: Diagnosis not present

## 2023-12-24 DIAGNOSIS — M2560 Stiffness of unspecified joint, not elsewhere classified: Secondary | ICD-10-CM | POA: Diagnosis not present

## 2023-12-24 DIAGNOSIS — M1A9XX1 Chronic gout, unspecified, with tophus (tophi): Secondary | ICD-10-CM | POA: Diagnosis not present

## 2024-01-09 ENCOUNTER — Telehealth: Payer: Self-pay | Admitting: Family Medicine

## 2024-01-09 NOTE — Telephone Encounter (Unsigned)
 Copied from CRM [id number]. Topic: General - Other >> Jan 09, 2024  9:19 AM Gennette ORN wrote: Reason for CRM: Patient is calling because the rheumatologist dr is requesting lab work to her by sept. 15. Patient also stated she is in pain but declined speaking to a nurse because she has a pain management dr. She also was suppose to have 3 month follow up scheduled for Sept. 11 but it was never scheduled back in June. I tried to schedule her but no openings for September. Please contact patient with appointment date and time.

## 2024-01-15 ENCOUNTER — Telehealth: Payer: Self-pay

## 2024-01-15 ENCOUNTER — Ambulatory Visit: Admitting: Family Medicine

## 2024-01-15 NOTE — Telephone Encounter (Signed)
 Forwarding message below. FYI missed appointment today due to gout flare up

## 2024-01-15 NOTE — Telephone Encounter (Signed)
 Copied from CRM [id number]. Topic: Clinical - Medical Advice >> Jan 15, 2024 11:29 AM Mesmerise C wrote: Reason for CRM: Patient stated she's barely able to move and get around stated she spoke to a 24 hour nurse and she has gout in her legs offered her to a triage nurse declined stated she may not make it to the appointment tomorrow said she was in pain

## 2024-01-21 ENCOUNTER — Encounter: Payer: Self-pay | Admitting: Family Medicine

## 2024-01-21 DIAGNOSIS — M1A9XX1 Chronic gout, unspecified, with tophus (tophi): Secondary | ICD-10-CM | POA: Diagnosis not present

## 2024-02-18 ENCOUNTER — Telehealth: Payer: Self-pay | Admitting: Family Medicine

## 2024-02-18 NOTE — Telephone Encounter (Signed)
 Copied from CRM [id number]. Topic: Appointments - Scheduling Inquiry for Clinic >> Feb 18, 2024  8:53 AM Mercedes MATSU wrote: Reason for CRM: Patient is requesting to speak with dr rudds nurse to see if she can squeeze her in to see him for pain in her mouth. Patient is requesting call back [phone].

## 2024-02-18 NOTE — Telephone Encounter (Signed)
 Patient has an appointment with her dentist on 03/23/24.  She also has na appointment with Dr Sebastian 02/19/24. Dm/cma

## 2024-02-19 ENCOUNTER — Other Ambulatory Visit: Payer: Self-pay | Admitting: Family Medicine

## 2024-02-19 ENCOUNTER — Ambulatory Visit (INDEPENDENT_AMBULATORY_CARE_PROVIDER_SITE_OTHER): Admitting: Family Medicine

## 2024-02-19 VITALS — BP 180/101 | HR 67 | Temp 97.0°F | Resp 18 | Wt 153.4 lb

## 2024-02-19 DIAGNOSIS — F17200 Nicotine dependence, unspecified, uncomplicated: Secondary | ICD-10-CM | POA: Diagnosis not present

## 2024-02-19 DIAGNOSIS — N183 Chronic kidney disease, stage 3 unspecified: Secondary | ICD-10-CM | POA: Diagnosis not present

## 2024-02-19 DIAGNOSIS — E1169 Type 2 diabetes mellitus with other specified complication: Secondary | ICD-10-CM | POA: Diagnosis not present

## 2024-02-19 DIAGNOSIS — K089 Disorder of teeth and supporting structures, unspecified: Secondary | ICD-10-CM

## 2024-02-19 DIAGNOSIS — G8929 Other chronic pain: Secondary | ICD-10-CM | POA: Diagnosis not present

## 2024-02-19 DIAGNOSIS — I10 Essential (primary) hypertension: Secondary | ICD-10-CM | POA: Diagnosis not present

## 2024-02-19 MED ORDER — HYDRALAZINE HCL 10 MG PO TABS: 10.0000 mg | ORAL_TABLET | Freq: Three times a day (TID) | ORAL | 0 refills | Status: DC | PRN

## 2024-02-19 NOTE — Patient Instructions (Addendum)
  VISIT SUMMARY: Today, we discussed your ongoing dental pain and exposed bone following your tooth extractions, your smoking habits, and your elevated blood pressure. We have made a plan to address each of these issues to help improve your overall health and comfort.  YOUR PLAN: ORAL PAIN AND EXPOSED BONE AFTER DENTAL EXTRACTION: You have persistent pain and exposed bone at the site of your tooth extractions, which is causing discomfort and headaches. -Please let us  know if you would like a referral to an oral surgeon for a second opinion and possible treatment. -Please try to reduce smoking as it can delay healing. -Continue using clove oil for pain relief. -You may also use Cepacol lozenges to help with the pain.  NICOTINE DEPENDENCE: You continue to smoke, which can impair healing after dental procedures. -Please try to reduce smoking to help with your oral healing. -Consider using nicotine patches to help you quit smoking.  HYPERTENSION: Your blood pressure is elevated, which may be related to stress from your dental issues. -We have prescribed hydralazine  10 mg to be taken three times a day if your systolic blood pressure is greater than 160 mmHg. -Continue taking your current blood pressure medications, amlodine-olmesartan-HCTZ. -Follow up with Dr. Thedora for hypertension management later this month.

## 2024-02-19 NOTE — Progress Notes (Signed)
 Assessment & Plan   Assessment/Plan:    Assessment & Plan Oral pain and exposed bone after dental extraction Persistent oral pain and exposed bone following dental extractions in September. Pain radiates to the neck and causes headaches. Examination shows a small sliver of exposed bone without signs of infection. Gum around the exposed bone is not red or infected. Smoking may contribute to delayed healing. She is fearful about the healing process and potential need for further intervention. Offered referral to oral surgery, however patient declined. - Follow-up with dentistry - Advise to reduce smoking to promote healing. - Continue using clove oil for pain relief. - Consider using OTC Cepacol lozenges for additional pain relief.  Nicotine dependence Continues to smoke, though reports reduced smoking since dental extractions.  Discussed that smoking impairs oral healing.  Previously used nicotine patches, but she is not wanting to quit. - Advise to reduce smoking to aid in oral healing. - Discuss use of nicotine patches as a smoking cessation aid.  Hypertension Blood pressure elevated at 180/101 mmHg. Reports fluctuating blood pressure readings at home, attributing current elevation to stress related to dental issues. Currently on olmesartan and HCTZ. Discussed potential addition of hydralazine  for resistant hypertension. Candidate for resistant hypertension clinic, however she declined referral. - Prescribe hydralazine  10 mg to be taken three times a day as needed if blood pressure is greater than 160 mmHg/100 mmHg  - Follow up with Dr. Thedora for hypertension management later this month.      There are no discontinued medications.  Return if symptoms worsen or fail to improve, for dental pain, blood pressure.        Subjective:   Encounter date: 02/19/2024  Gail Humphrey is a 68 y.o. female who has Vitamin D deficiency; Type 2 diabetes mellitus with other specified  complication (HCC); Presence of aortocoronary bypass graft; Other intervertebral disc degeneration, lumbosacral region; Spinal stenosis of lumbar region; Lumbar radiculopathy; Hypomagnesemia; Hyperlipidemia; Essential hypertension; Chronic pain; Chronic kidney disease, stage 3 unspecified (HCC); Coronary artery disease; Tophaceous gout; Tobacco use disorder, mild, abuse; and Chronic dental pain on their problem list..   She  has a past medical history of Arthritis, Chronic lower back pain, Diabetes mellitus, heart bypass surgery, Hypertension, Pain management, and Sciatica..   She presents with chief complaint of Dental Pain (Pt c/o of dental pain for 1 month due to extractions. //HM due- diabetic eye exam, vaccinations, and mammogram ) .   Discussed the use of AI scribe software for clinical note transcription with the patient, who gave verbal consent to proceed.  History of Present Illness Gail Humphrey is a 68 year old female who presents with dental pain and exposed bone following tooth extractions.  Post-extraction dental pain and exposed bone - Severe dental pain since tooth extractions in September, beginning immediately after anesthesia wore off - Persistent exposed bone at extraction site, described as 'bone or a flap' - Pain and swelling in the face, particularly on both sides, with sensation of a 'flap or bubble' - Pain radiates down the neck - Associated with new-onset headaches - Discomfort disrupts sleep - Concern about healing process and potential need for further surgery - No fever, chills, or bleeding from the site - Has returned to the dentist three times without improvement  Analgesic use - Uses clove oil and Orajel for dental pain relief - Currently taking Percocet for a back problem - Occasionally uses ibuprofen  for gas  Tobacco use - Smokes, but has reduced smoking since the  extractions  Blood pressure concerns - Elevated blood pressure, attributed to stress  and lack of sleep - Currently taking olmesartan and HCTZ for hypertension; took medication at 10 AM - Monitors blood pressure at home and notes fluctuations - Upcoming appointment scheduled to address blood pressure     ROS  Past Surgical History:  Procedure Laterality Date   CORONARY ARTERY BYPASS GRAFT  4 vessel   TOTAL ABDOMINAL HYSTERECTOMY      Outpatient Medications Prior to Visit  Medication Sig Dispense Refill   allopurinol  (ZYLOPRIM ) 300 MG tablet Take 1 tablet (300 mg total) by mouth daily. 90 tablet 3   chlorhexidine (PERIDEX) 0.12 % solution SMARTSIG:0.5 Ounce(s) Twice Daily     magnesium oxide (MAG-OX) 400 (240 Mg) MG tablet Take 1 tablet by mouth daily.     MAGNESIUM PO Take by mouth.     Olmesartan-Amlodipine-HCTZ (TRIBENZOR PO) Take by mouth.     oxyCODONE -acetaminophen  (PERCOCET) 10-325 MG tablet      rosuvastatin  (CRESTOR ) 10 MG tablet Take 1 tablet (10 mg total) by mouth daily. 90 tablet 3   cyanocobalamin (VITAMIN B12) 500 MCG tablet Take 500 mcg by mouth daily.     methocarbamol  (ROBAXIN ) 500 MG tablet Take 500 mg by mouth every 6 (six) hours as needed.     No facility-administered medications prior to visit.    Family History  Problem Relation Age of Onset   Heart disease Mother    Diabetes Father    Diabetes Sister    Stroke Maternal Grandmother     Social History   Socioeconomic History   Marital status: Single    Spouse name: Not on file   Number of children: 1   Years of education: Not on file   Highest education level: Associate degree: occupational, Scientist, product/process development, or vocational program  Occupational History   Occupation: Retired    Comment: [employer]  Tobacco Use   Smoking status: Every Day    Current packs/day: 0.50    Types: Cigarettes   Smokeless tobacco: Never  Vaping Use   Vaping status: Never Used  Substance and Sexual Activity   Alcohol use: Yes    Comment: weekly   Drug use: No   Sexual activity: Yes  Other Topics Concern   Not  on file  Social History Narrative   Not on file   Social Drivers of Health   Financial Resource Strain: Low Risk  (02/19/2024)   Overall Financial Resource Strain (CARDIA)    Difficulty of Paying Living Expenses: Not hard at all  Food Insecurity: No Food Insecurity (02/19/2024)   Hunger Vital Sign    Worried About Running Out of Food in the Last Year: Never true    Ran Out of Food in the Last Year: Never true  Transportation Needs: No Transportation Needs (02/19/2024)   PRAPARE - Administrator, Civil Service (Medical): No    Lack of Transportation (Non-Medical): No  Physical Activity: Not on file  Stress: No Stress Concern Present (02/19/2024)   [employer] of Occupational Health - Occupational Stress Questionnaire    Feeling of Stress: Only a little  Social Connections: Unknown (02/19/2024)   Social Connection and Isolation Panel    Frequency of Communication with Friends and Family: Three times a week    Frequency of Social Gatherings with Friends and Family: Three times a week    Attends Religious Services: 1 to 4 times per year    Active Member of Clubs or Organizations: Patient  declined    Attends Banker Meetings: Not on file    Marital Status: Widowed  Catering manager Violence: Not on file                                                                                                  Objective:  Physical Exam: BP (!) 180/101 (BP Location: Right Arm, Patient Position: Sitting, Cuff Size: Normal) Comment: recheck  Pulse 67   Temp (!) 97 F (36.1 C) (Temporal)   Resp 18   Wt 153 lb 6.4 oz (69.6 kg)   SpO2 100%   BMI 25.53 kg/m    Physical Exam VITALS: BP- 180/101 GENERAL: Alert, cooperative, well developed, no acute distress. HEENT: Normocephalic, normal oropharynx, moist mucous membranes, pink gums, bilateral small and protrusion of bone in lower jaw, nontender to palpation, no erythema surrounding them or purulent drainage   CHEST: Clear to auscultation bilaterally, no wheezes, rhonchi, or crackles. CARDIOVASCULAR: Normal heart rate and rhythm, S1 and S2 normal without murmurs. ABDOMEN: Soft, non-tender, non-distended, without organomegaly, normal bowel sounds. EXTREMITIES: No cyanosis or edema. NEUROLOGICAL: Cranial nerves grossly intact, moves all extremities without gross motor or sensory deficit.   Physical Exam  No results found.  No results found for this or any previous visit (from the past 2160 hours).      Beverley Adine Hummer, MD, MS

## 2024-02-25 DIAGNOSIS — M48062 Spinal stenosis, lumbar region with neurogenic claudication: Secondary | ICD-10-CM | POA: Diagnosis not present

## 2024-02-25 DIAGNOSIS — M51372 Other intervertebral disc degeneration, lumbosacral region with discogenic back pain and lower extremity pain: Secondary | ICD-10-CM | POA: Diagnosis not present

## 2024-02-25 DIAGNOSIS — G894 Chronic pain syndrome: Secondary | ICD-10-CM | POA: Diagnosis not present

## 2024-03-13 ENCOUNTER — Ambulatory Visit: Admitting: Family Medicine

## 2024-03-20 ENCOUNTER — Other Ambulatory Visit: Payer: Self-pay | Admitting: Family Medicine

## 2024-03-20 DIAGNOSIS — I10 Essential (primary) hypertension: Secondary | ICD-10-CM

## 2024-04-11 ENCOUNTER — Ambulatory Visit
